# Patient Record
Sex: Male | Born: 1935 | Race: White | Hispanic: No | Marital: Married | Smoking: Never smoker
Health system: Southern US, Community
[De-identification: ages and names within clinical notes are randomized; demographics above are authoritative.]

## PROBLEM LIST (undated history)

## (undated) DIAGNOSIS — I639 Cerebral infarction, unspecified: Secondary | ICD-10-CM

## (undated) DIAGNOSIS — I4891 Unspecified atrial fibrillation: Secondary | ICD-10-CM

## (undated) DIAGNOSIS — F419 Anxiety disorder, unspecified: Secondary | ICD-10-CM

## (undated) HISTORY — PX: OTHER SURGICAL HISTORY: SHX169

## (undated) HISTORY — PX: CATARACT EXTRACTION: SUR2

## (undated) HISTORY — PX: SHOULDER SURGERY: SHX246

---

## 2004-08-11 ENCOUNTER — Emergency Department (HOSPITAL_COMMUNITY): Admission: EM | Admit: 2004-08-11 | Discharge: 2004-08-11 | Payer: Self-pay | Admitting: Emergency Medicine

## 2009-05-02 ENCOUNTER — Encounter: Admission: RE | Admit: 2009-05-02 | Discharge: 2009-05-30 | Payer: Self-pay | Admitting: Family Medicine

## 2009-05-16 ENCOUNTER — Encounter: Admission: RE | Admit: 2009-05-16 | Discharge: 2009-05-16 | Payer: Self-pay | Admitting: Family Medicine

## 2011-02-27 ENCOUNTER — Inpatient Hospital Stay (HOSPITAL_COMMUNITY)
Admission: RE | Admit: 2011-02-27 | Discharge: 2011-03-01 | DRG: 508 | Disposition: A | Discharge status: Home Health | Payer: Medicare Other | Source: Ambulatory Visit | Attending: Orthopedic Surgery | Admitting: Orthopedic Surgery

## 2011-02-27 DIAGNOSIS — M009 Pyogenic arthritis, unspecified: Secondary | ICD-10-CM

## 2011-02-27 LAB — CBC
HCT: 41.7 % (ref 39.0–52.0)
HCT: 39.4 % (ref 39.0–52.0)
Hemoglobin: 14.1 g/dL (ref 13.0–17.0)
MCV: 88.9 fL (ref 78.0–100.0)
Platelets: 254 10*3/uL (ref 150–400)
RBC: 4.69 MIL/uL (ref 4.22–5.81)
RBC: 4.44 MIL/uL (ref 4.22–5.81)
RDW: 13 % (ref 11.5–15.5)
WBC: 5.7 10*3/uL (ref 4.0–10.5)
WBC: 7.1 10*3/uL (ref 4.0–10.5)

## 2011-02-27 LAB — BASIC METABOLIC PANEL
BUN: 22 mg/dL (ref 6–23)
CO2: 28 mEq/L (ref 19–32)
Calcium: 9.8 mg/dL (ref 8.4–10.5)
Chloride: 101 mEq/L (ref 96–112)
GFR calc non Af Amer: 58 mL/min — ABNORMAL LOW (ref >60)
Glucose, Bld: 95 mg/dL (ref 70–99)

## 2011-02-27 LAB — URINALYSIS, ROUTINE W REFLEX MICROSCOPIC
Glucose, UA: NEGATIVE mg/dL
Hgb urine dipstick: NEGATIVE
Ketones, ur: NEGATIVE mg/dL
Protein, ur: NEGATIVE mg/dL
pH: 5.5 (ref 5.0–8.0)

## 2011-02-27 LAB — DIFFERENTIAL
Basophils Absolute: 0 10*3/uL (ref 0.0–0.1)
Basophils Absolute: 0 10*3/uL (ref 0.0–0.1)
Basophils Relative: 1 % (ref 0–1)
Basophils Relative: 0 % (ref 0–1)
Eosinophils Absolute: 0.2 10*3/uL (ref 0.0–0.7)
Eosinophils Absolute: 0.1 10*3/uL (ref 0.0–0.7)
Eosinophils Relative: 3 % (ref 0–5)
Eosinophils Relative: 1 % (ref 0–5)
Lymphocytes Relative: 25 % (ref 12–46)
Lymphs Abs: 0.9 10*3/uL (ref 0.7–4.0)
Monocytes Absolute: 0.4 10*3/uL (ref 0.1–1.0)
Neutro Abs: 5.8 10*3/uL (ref 1.7–7.7)
Neutrophils Relative %: 81 % — ABNORMAL HIGH (ref 43–77)

## 2011-02-27 LAB — GRAM STAIN

## 2011-02-27 LAB — C-REACTIVE PROTEIN: CRP: 0.21 mg/dL — ABNORMAL LOW (ref <0.60)

## 2011-02-27 LAB — APTT: aPTT: 27 seconds (ref 24–37)

## 2011-02-28 ENCOUNTER — Inpatient Hospital Stay (HOSPITAL_COMMUNITY): Payer: Medicare Other

## 2011-02-28 LAB — CBC
MCH: 31.1 pg (ref 26.0–34.0)
MCV: 88.4 fL (ref 78.0–100.0)
Platelets: 196 10*3/uL (ref 150–400)
RDW: 12.8 % (ref 11.5–15.5)

## 2011-02-28 LAB — BASIC METABOLIC PANEL
BUN: 16 mg/dL (ref 6–23)
CO2: 24 mEq/L (ref 19–32)
Chloride: 104 mEq/L (ref 96–112)
Creatinine, Ser: 1.05 mg/dL (ref 0.50–1.35)
GFR calc Af Amer: >60 mL/min (ref >60)
Glucose, Bld: 85 mg/dL (ref 70–99)

## 2011-03-01 DIAGNOSIS — M009 Pyogenic arthritis, unspecified: Secondary | ICD-10-CM

## 2011-03-01 LAB — CBC
HCT: 39.1 % (ref 39.0–52.0)
MCH: 31.7 pg (ref 26.0–34.0)
MCHC: 35.8 g/dL (ref 30.0–36.0)
RDW: 12.5 % (ref 11.5–15.5)

## 2011-03-01 LAB — BASIC METABOLIC PANEL
BUN: 16 mg/dL (ref 6–23)
CO2: 28 mEq/L (ref 19–32)
Chloride: 103 mEq/L (ref 96–112)
Creatinine, Ser: 1.12 mg/dL (ref 0.50–1.35)
Potassium: 4.1 mEq/L (ref 3.5–5.1)

## 2011-03-03 LAB — BODY FLUID CULTURE: Culture: NO GROWTH

## 2011-03-04 LAB — ANAEROBIC CULTURE

## 2011-03-15 NOTE — Op Note (Signed)
  NAMERICHIE, BONANNO                 ACCOUNT NO.:  [id number]  MEDICAL RECORD NO.:  [id number]  LOCATION:  5030                         FACILITY:  [facility]  PHYSICIAN:  Almedia Balls. Ranell Patrick, M.D. DATE OF BIRTH:  18-Nov-1934  DATE OF PROCEDURE:  02/27/2011 DATE OF DISCHARGE:                              OPERATIVE REPORT   PREOPERATIVE DIAGNOSIS:  Left shoulder infection.  POSTOPERATIVE DIAGNOSIS:  Left shoulder inflammation.  PROCEDURE PERFORMED:  Left shoulder arthroscopic incision and drainage both intra-articular and subacromial.  ATTENDING SURGEON:  Almedia Balls. Ranell Patrick, MD  ASSISTANT:  None.  ANESTHESIA:  General anesthesia was used.  ESTIMATED BLOOD LOSS:  Minimal.  FLUID REPLACEMENT:  1000 mL crystalloid.  INSTRUMENT COUNT:  Correct.  COMPLICATIONS:  None.  Preoperative antibiotics were given.  INDICATIONS:  The patient is a 76 year old male with history of left shoulder infection following shoulder arthroscopy and cuff repair.  Hehad an arthroscopic and open I and D about a week and half ago.  The patient has had persistent pain and a pressure-type filling in his shoulder and some swelling and he was brought back for secondary I and D today and inpatient admission for placement of PICC line and potential IV antibiotics were obtained IV consult as well.  DESCRIPTION OF PROCEDURE:  After an adequate level of anesthesia was achieved, the patient was positioned in the modified beach chair position.  Left shoulder was sterilely prepped and draped in the usual manner.  We entered the shoulder using standard arthroscopic portals including anterior, posterior and lateral portals.  Culture the fluid from the inside of the shoulder was cultured.  There was an effusion. This was bloody-type fluid.  No clotting to it.  Aerobic, anaerobic cultures and stat Gram stain which came back negative organisms with scattered white blood cells, mostly polys.  The patient then had a arthroscopic  I and D of the shoulder joint, where we noted the rotator cuff repair to be intact, lot of inflammation synovitis from the joint, but no signs of any obvious infection, although tissue appeared in shape.  This is hyperemic.  We did about 3 L in the joint and then we did 3 L subdeltoid.  The cuff repair again was fine from above.  PDS suture still intact and no sign of any significant purulent pocket served clot fluid after thorough subdeltoid and sub-interarticular arthroscopic I and D with inspection of the deltoid repair which was intact.  We went ahead and concluded the surgery suturing the wounds with 3-0 nylon.  Sterile compressive bandage was applied.  The patient placed a shoulder sling, taken to recovery room.     Almedia Balls. Ranell Patrick, M.D.     SRN/MEDQ  D:  02/27/2011  T:  02/27/2011  Job:  [id number]  Electronically Signed by Malon Kindle  on 03/15/2011 03:11:06 AM

## 2011-03-15 NOTE — Discharge Summary (Signed)
  NAMEANIEL, Brent Webb                 ACCOUNT NO.:  [id number]  MEDICAL RECORD NO.:  [id number]  LOCATION:  5030                         FACILITY:  [facility]  PHYSICIAN:  Almedia Balls. Ranell Patrick, M.D. DATE OF BIRTH:  1935-01-29  DATE OF ADMISSION:  02/27/2011 DATE OF DISCHARGE:  03/01/2011                              DISCHARGE SUMMARY   ADMISSION DIAGNOSIS:  Left shoulder infection versus inflammation.  DISCHARGE DIAGNOSIS:  Left shoulder infection versus inflammation, status post I and D.  BRIEF HISTORY:  The patient is a 76 year old male with worsening swelling, possible infection to the left shoulder.  The patient has been admitted prior to this admission for local I and D.  His inflammation has returned, thus patient was brought back in for IV antibiotics and a repeat irrigation and debridement.  PROCEDURE:  The patient had a left shoulder arthroscopic incision and drainage by Dr. Malon Kindle on February 27, 2011.  General anesthesia was used.  No complications.  HOSPITAL COURSE:  The patient was admitted on February 27, 2011, for the above-stated procedure which he tolerated well.  After adequate time in Post Anesthesia Care Unit, he was transferred to 5000.  The patient did have a PICC line placed and also Infectious Disease consult brought in who was recommended to treat him with broad-spectrum Rocephin and vancomycin for any type of infection to be in his left shoulder.  The patient was feeling well.  No pain.  Neurovascularly, he is intact and distally.  Capillary refill is less than 2 seconds and overall he was doing great.  Labs were within acceptable limits.  The patient to be discharged home on postop day #2 after 48 hours of IV antibiotics.  The patient overall was doing great and neurovascularly he is intact.  His labs were within acceptable limits.  DISCHARGE/PLAN:  The patient will be discharged home with PICC line and home health nursing on March 01, 2011.  His  condition is stable. His diet is regular.  DISCHARGE MEDICATIONS:  Rocephin 1 g IV daily, vancomycin 750 mg b.i.d. IV.  The patient declined need for pain medicines.  FOLLOWUP:  The patient to follow back up with Dr. Malon Kindle in 1 week.     Thomas B. Dixon, P.A.   ______________________________ Almedia Balls. Ranell Patrick, M.D.    TBD/MEDQ  D:  03/04/2011  T:  03/04/2011  Job:  [id number]  Electronically Signed by Standley Dakins P.A. on 03/06/2011 09:43:07 AM Electronically Signed by Malon Kindle  on 03/15/2011 03:11:03 AM

## 2011-03-22 ENCOUNTER — Encounter: Payer: Self-pay | Admitting: Infectious Diseases

## 2011-03-29 ENCOUNTER — Ambulatory Visit: Payer: Medicare Other | Admitting: Infectious Diseases

## 2011-07-04 ENCOUNTER — Encounter: Payer: Self-pay | Admitting: Infectious Diseases

## 2012-09-17 ENCOUNTER — Inpatient Hospital Stay (HOSPITAL_BASED_OUTPATIENT_CLINIC_OR_DEPARTMENT_OTHER)
Admission: EM | Admit: 2012-09-17 | Discharge: 2012-09-21 | DRG: 064 | Disposition: A | Discharge status: Home / Self Care | Payer: Medicare Other | Attending: Neurosurgery | Admitting: Neurosurgery

## 2012-09-17 ENCOUNTER — Encounter (HOSPITAL_BASED_OUTPATIENT_CLINIC_OR_DEPARTMENT_OTHER): Payer: Self-pay | Admitting: Family Medicine

## 2012-09-17 ENCOUNTER — Inpatient Hospital Stay (HOSPITAL_COMMUNITY): Payer: Medicare Other

## 2012-09-17 ENCOUNTER — Emergency Department (HOSPITAL_BASED_OUTPATIENT_CLINIC_OR_DEPARTMENT_OTHER): Payer: Medicare Other

## 2012-09-17 DIAGNOSIS — R569 Unspecified convulsions: Secondary | ICD-10-CM | POA: Diagnosis present

## 2012-09-17 DIAGNOSIS — G936 Cerebral edema: Secondary | ICD-10-CM | POA: Diagnosis present

## 2012-09-17 DIAGNOSIS — R209 Unspecified disturbances of skin sensation: Secondary | ICD-10-CM | POA: Diagnosis present

## 2012-09-17 DIAGNOSIS — I639 Cerebral infarction, unspecified: Secondary | ICD-10-CM

## 2012-09-17 DIAGNOSIS — F411 Generalized anxiety disorder: Secondary | ICD-10-CM | POA: Diagnosis present

## 2012-09-17 DIAGNOSIS — I619 Nontraumatic intracerebral hemorrhage, unspecified: Principal | ICD-10-CM | POA: Diagnosis present

## 2012-09-17 DIAGNOSIS — K7689 Other specified diseases of liver: Secondary | ICD-10-CM | POA: Diagnosis present

## 2012-09-17 DIAGNOSIS — I629 Nontraumatic intracranial hemorrhage, unspecified: Secondary | ICD-10-CM

## 2012-09-17 DIAGNOSIS — G40109 Localization-related (focal) (partial) symptomatic epilepsy and epileptic syndromes with simple partial seizures, not intractable, without status epilepticus: Secondary | ICD-10-CM

## 2012-09-17 DIAGNOSIS — G8190 Hemiplegia, unspecified affecting unspecified side: Secondary | ICD-10-CM | POA: Diagnosis present

## 2012-09-17 DIAGNOSIS — R279 Unspecified lack of coordination: Secondary | ICD-10-CM | POA: Diagnosis present

## 2012-09-17 DIAGNOSIS — D496 Neoplasm of unspecified behavior of brain: Secondary | ICD-10-CM | POA: Diagnosis present

## 2012-09-17 HISTORY — DX: Cerebral infarction, unspecified: I63.9

## 2012-09-17 HISTORY — DX: Anxiety disorder, unspecified: F41.9

## 2012-09-17 LAB — CBC WITH DIFFERENTIAL/PLATELET
Basophils Absolute: 0 10*3/uL (ref 0.0–0.1)
Lymphocytes Relative: 22 % (ref 12–46)
Lymphs Abs: 1.1 10*3/uL (ref 0.7–4.0)
MCV: 85.6 fL (ref 78.0–100.0)
Neutro Abs: 3.6 10*3/uL (ref 1.7–7.7)
Neutrophils Relative %: 69 % (ref 43–77)
Platelets: 231 10*3/uL (ref 150–400)
RBC: 4.85 MIL/uL (ref 4.22–5.81)
RDW: 12.4 % (ref 11.5–15.5)
WBC: 5.2 10*3/uL (ref 4.0–10.5)

## 2012-09-17 LAB — URINALYSIS, ROUTINE W REFLEX MICROSCOPIC
Bilirubin Urine: NEGATIVE
Glucose, UA: NEGATIVE mg/dL
Hgb urine dipstick: NEGATIVE
Protein, ur: NEGATIVE mg/dL
Urobilinogen, UA: 0.2 mg/dL (ref 0.0–1.0)

## 2012-09-17 LAB — APTT: aPTT: 27 seconds (ref 24–37)

## 2012-09-17 LAB — COMPREHENSIVE METABOLIC PANEL
ALT: 17 U/L (ref 0–53)
AST: 22 U/L (ref 0–37)
Alkaline Phosphatase: 53 U/L (ref 39–117)
CO2: 21 mEq/L (ref 19–32)
Chloride: 100 mEq/L (ref 96–112)
GFR calc Af Amer: 66 mL/min — ABNORMAL LOW (ref >90)
GFR calc non Af Amer: 57 mL/min — ABNORMAL LOW (ref >90)
Glucose, Bld: 122 mg/dL — ABNORMAL HIGH (ref 70–99)
Potassium: 4.1 mEq/L (ref 3.5–5.1)
Sodium: 136 mEq/L (ref 135–145)

## 2012-09-17 LAB — URINE MICROSCOPIC-ADD ON

## 2012-09-17 LAB — GLUCOSE, CAPILLARY: Glucose-Capillary: 101 mg/dL — ABNORMAL HIGH (ref 70–99)

## 2012-09-17 LAB — PROTIME-INR: Prothrombin Time: 12.4 seconds (ref 11.6–15.2)

## 2012-09-17 LAB — SURGICAL PCR SCREEN: MRSA, PCR: NEGATIVE

## 2012-09-17 MED ORDER — SODIUM CHLORIDE 0.9 % IV SOLN
1000.0000 mg | Freq: Once | INTRAVENOUS | Status: DC
  Filled 2012-09-17: qty 10

## 2012-09-17 MED ORDER — DEXAMETHASONE SODIUM PHOSPHATE 4 MG/ML IJ SOLN
8.0000 mg | Freq: Once | INTRAMUSCULAR | Status: AC
  Administered 2012-09-17: 8 mg via INTRAVENOUS
  Filled 2012-09-17: qty 1
  Filled 2012-09-17: qty 2
  Filled 2012-09-17: qty 4

## 2012-09-17 MED ORDER — PANTOPRAZOLE SODIUM 40 MG PO TBEC
40.0000 mg | DELAYED_RELEASE_TABLET | Freq: Every day | ORAL | Status: DC
  Administered 2012-09-17 – 2012-09-21 (×5): 40 mg via ORAL
  Filled 2012-09-17 (×5): qty 1

## 2012-09-17 MED ORDER — LORAZEPAM 2 MG/ML IJ SOLN
INTRAMUSCULAR | Status: AC
  Administered 2012-09-17: 0.5 mg
  Filled 2012-09-17: qty 1

## 2012-09-17 MED ORDER — DIPHENHYDRAMINE HCL 50 MG PO CAPS
50.0000 mg | ORAL_CAPSULE | Freq: Once | ORAL | Status: DC
  Filled 2012-09-17: qty 1

## 2012-09-17 MED ORDER — HYDROCODONE-ACETAMINOPHEN 10-325 MG PO TABS: 1.0000 | ORAL_TABLET | ORAL | Status: DC | PRN

## 2012-09-17 MED ORDER — SODIUM CHLORIDE 0.9 % IV SOLN
INTRAVENOUS | Status: DC
  Administered 2012-09-17 – 2012-09-18 (×3): via INTRAVENOUS

## 2012-09-17 MED ORDER — GADOBENATE DIMEGLUMINE 529 MG/ML IV SOLN
15.0000 mL | Freq: Once | INTRAVENOUS | Status: AC
  Administered 2012-09-17: 15 mL via INTRAVENOUS

## 2012-09-17 MED ORDER — LEVETIRACETAM ER 500 MG PO TB24
500.0000 mg | ORAL_TABLET | Freq: Every day | ORAL | Status: DC
  Administered 2012-09-17 – 2012-09-21 (×5): 500 mg via ORAL
  Filled 2012-09-17 (×7): qty 1

## 2012-09-17 MED ORDER — DEXAMETHASONE 4 MG PO TABS
4.0000 mg | ORAL_TABLET | Freq: Four times a day (QID) | ORAL | Status: DC
  Administered 2012-09-17 – 2012-09-21 (×16): 4 mg via ORAL
  Filled 2012-09-17 (×18): qty 1

## 2012-09-17 NOTE — ED Notes (Addendum)
Per [facility], pt was lying on floor at [facility], did not fall, no loc, pt was having anxiety attack with h/o same. Vitals wnl. Pt reports feeling dizzy, pain to left chest and legs "tight and twitching" while at store. Pt also sts he felt "pressure and needed to burp".

## 2012-09-17 NOTE — Progress Notes (Signed)
Patient ID: Brent Webb, male   DOB: 11-03-1934, 78 y.o.   MRN: [id number] Mri seen. Radiologist called the lesion as a hemorrhagic stroke but i am concern a the possibility of a tumor. i did speak with the patient and wife and fully explained about the findings. i will get a neurology consult and repeat the mri in 7 to 10 days. Both agree with the plan

## 2012-09-17 NOTE — ED Provider Notes (Signed)
History     CSN: [id number]  Arrival date & time 09/17/12  1149   First MD Initiated Contact with Patient 09/17/12 1206      Chief Complaint  Patient presents with  . Panic Attack    (Consider location/radiation/quality/duration/timing/severity/associated sxs/prior treatment) HPI Pt with sudden onset twitching in LLE and L hip while shopping. Pt became anxious and began deep breathing. Symptoms have now resolved. No CP at any point. No SOB, cough, fever, chills, HA, focal weakness. Per EMS presentation was consistent with anxiety attack for which pt has history of the same.  Past Medical History  Diagnosis Date  . Anxiety     Past Surgical History  Procedure Laterality Date  . Cataract extraction      History reviewed. No pertinent family history.  History  Substance Use Topics  . Smoking status: Never Smoker   . Smokeless tobacco: Not on file  . Alcohol Use: Yes      Review of Systems  Constitutional: Negative for fever and chills.  HENT: Negative for neck pain.   Eyes: Negative for visual disturbance.  Respiratory: Negative for cough, chest tightness and shortness of breath.   Cardiovascular: Negative for chest pain, palpitations and leg swelling.  Gastrointestinal: Negative for nausea, vomiting and abdominal pain.  Genitourinary: Negative for dysuria.  Musculoskeletal: Negative for myalgias and back pain.  Skin: Negative for pallor, rash and wound.  Neurological: Positive for tremors. Negative for dizziness, syncope, weakness, light-headedness, numbness and headaches.  Psychiatric/Behavioral: The patient is nervous/anxious.   All other systems reviewed and are negative.    Allergies  Review of patient's allergies indicates no known allergies.  Home Medications   No current outpatient prescriptions on file.  BP 131/56  Pulse 60  Temp(Src) 97.4 F (36.3 C) (Oral)  Resp 18  Ht 5\' 9"  (1.753 m)  Wt 143 lb 1.3 oz (64.9 kg)  BMI 21.12 kg/m2  SpO2  95%  Physical Exam  Nursing note and vitals reviewed. Constitutional: He is oriented to person, place, and time. He appears well-developed and well-nourished. No distress.  HENT:  Head: Normocephalic and atraumatic.  Mouth/Throat: Oropharynx is clear and moist.  Eyes: EOM are normal. Pupils are equal, round, and reactive to light.  Neck: Normal range of motion. Neck supple.  Cardiovascular: Normal rate and regular rhythm.   Pulmonary/Chest: Effort normal and breath sounds normal. No respiratory distress. He has no wheezes. He has no rales. He exhibits no tenderness.  Abdominal: Soft. Bowel sounds are normal. He exhibits no distension and no mass. There is no tenderness. There is no rebound and no guarding.  Musculoskeletal: Normal range of motion. He exhibits no edema and no tenderness.  No calf swelling or tenderness  Neurological: He is alert and oriented to person, place, and time.  5/5 motor in RUE/RLE. Sensation intact. 4/5 motor LUE/LLE.  Dyskinesis with LUE/LLE  Skin: Skin is warm and dry. No rash noted. No erythema.  Psychiatric: He has a normal mood and affect. His behavior is normal.    ED Course  Procedures (including critical care time)  Labs Reviewed  CBC WITH DIFFERENTIAL - Abnormal; Notable for the following:    MCHC 36.4 (*)    All other components within normal limits  COMPREHENSIVE METABOLIC PANEL - Abnormal; Notable for the following:    Glucose, Bld 122 (*)    GFR calc non Af Amer 57 (*)    GFR calc Af Amer 66 (*)    All other components within  normal limits  URINALYSIS, ROUTINE W REFLEX MICROSCOPIC - Abnormal; Notable for the following:    Ketones, ur 15 (*)    Leukocytes, UA TRACE (*)    All other components within normal limits  GLUCOSE, CAPILLARY - Abnormal; Notable for the following:    Glucose-Capillary 101 (*)    All other components within normal limits  SURGICAL PCR SCREEN  TROPONIN I  URINE MICROSCOPIC-ADD ON  PROTIME-INR  APTT  SEDIMENTATION  RATE   Ct Head Wo Contrast  09/17/2012  *RADIOLOGY REPORT*  Clinical Data: Dizziness, unsteady gait.  CT HEAD WITHOUT CONTRAST  Technique:  Contiguous axial images were obtained from the base of the skull through the vertex without contrast.  Comparison: None.  Findings: Bony calvarium appears intact.  There is noted a 2 cm hyperdensity seen involving the superior portion of the right parietal cortex with a large amount of surrounding white matter edema; this is consistent with intraparenchymal hemorrhage and potentially may be related to underlying neoplasm given the amount of white matter edema present.  Small amount of subarachnoid hemorrhage is seen medially in the vertex of the right parietal lobe.  Ventricular size is within normal limits.  No significant midline shift is noted currently.  IMPRESSION: Focal hyperdensity seen in right parietal cortex superiorly consistent with intraparenchymal hemorrhage.  Given the large amount of subcortical white matter edema present, this may be related to underlying neoplasm or malignancy.  MRI scan is recommended for further evaluation.  Small amount of subarachnoid hemorrhage is seen involving the medial portion of the vertex of the right parietal cortex.  Critical Value/emergent results were called by telephone at the time of interpretation on September 17, 2012 at 01:30 p.m. to Dr. Ranae Palms, who verbally acknowledged these results.   Original Report Authenticated By: Lupita Raider.,  M.D.    Ct Chest W Contrast  09/18/2012  *RADIOLOGY REPORT*  Clinical Data:  Right parietal brain mass.  Evaluate for primary malignancy or metastases.  CT CHEST, ABDOMEN AND PELVIS WITH CONTRAST  Technique:  Multidetector CT imaging of the chest, abdomen and pelvis was performed following the standard protocol during bolus administration of intravenous contrast.  Contrast: OMNIPAQUE IOHEXOL 300 MG/ML  SOLN  Comparison:  MRI brain 09/17/2012.  Chest radiographs 02/28/2011.  CT  CHEST  Findings:  There are no enlarged mediastinal, hilar or axillary lymph nodes.  There is minimal atherosclerosis of the aorta and coronary arteries.  There is no pleural or pericardial effusion.  Mild emphysematous changes are present.  No primary malignancy is identified within the chest.  There is a focal 5 mm ground-glass opacity in the right lower lobe on image 54, unlikely be clinically significant.  Nodular thickening along the left major fissure on image 37 is probably due to an intrapulmonary lymph node.  There are no worrisome osseous findings.  IMPRESSION: No evidence of primary malignancy or metastatic disease within the chest.  CT ABDOMEN AND PELVIS  Findings:  The hepatic parenchyma is heterogeneous with multiple small low-density lesions.  The largest of these is situated superiorly in the lateral segment of the left hepatic lobe, measuring 1.3 cm on image 52.  This demonstrates peripheral enhancement and could reflect a hemangioma.  The other lesions are too small to characterize.  Both adrenal glands demonstrate mild nodularity without dominant mass.  The spleen, pancreas and gallbladder appear normal.  There are right renal cystic lesions, the largest in the upper pole measuring 4.1 cm on image 67.  This  appears mildly complex.  However, no solid lesion is identified.  The left kidney is diffusely atrophied with cortical scarring and caliectasis. There are small left renal calculi.  No bowel lesions are identified.  There is moderate descending and sigmoid colon diverticulosis.  The appendix appears normal.  There are no enlarged abdominal pelvic lymph nodes.  The urinary bladder appears normal.  The prostate gland is mildly enlarged.  Degenerative changes are present throughout the spine.  There are no worrisome osseous findings.  IMPRESSION:  1.  No primary malignancy is identified within the abdomen or pelvis. 2.  Hepatic heterogeneity with multiple indeterminate small low- density lesions.  These could reflect metastases. 3.  Mild bilateral adrenal nodularity. 4.  Mildly complex cystic lesion in the upper pole of the right kidney is unlikely to account for the intracranial abnormality. Atrophy and cortical scarring of the left kidney.   Original Report Authenticated By: Carey Bullocks, M.D.    Mr Laqueta Jean Wo Contrast  09/17/2012  *RADIOLOGY REPORT*  Clinical Data: Abnormality on CT.  Rule out intracranial tumor. Dizziness and unsteady gait.  MRI HEAD WITHOUT AND WITH CONTRAST  Technique:  Multiplanar, multiecho pulse sequences of the brain and surrounding structures were obtained according to standard protocol without and with intravenous contrast  Contrast: 15mL MULTIHANCE GADOBENATE DIMEGLUMINE 529 MG/ML IV SOLN  Comparison: 09/17/2012 head CT.  No comparison MR brain.  Findings: Right parietal lobe of 5 x 3 x 3 cm hematoma.  Adjacent 1.3 x 1 x 1.2 cm hematoma.  Marked surrounding vasogenic edema. Local mass effect upon the right lateral ventricle which is displaced anteriorly and slightly inferiorly.  The cause of the hematoma is indeterminate.  This may represent changes of congophillic/amyloid angiopathy.  Result of cortical vein thrombosis is a possibility.  The adjacent major dural sinuses appear patent.  There is mild peripheral enhancement involving portions of the hematoma but without well-defined mass separate from hematoma. This will need to be further evaluated on follow-up imaging as hematoma clears to confirm that the hematoma clears as expected with a simple hematoma and rule out underlying mass not appreciated on the present exam.  There are prominent vessels in the region of the right parietal lobe hematoma which may represent compressed cortical vessels rather than vascular malformation.  Attention to this possibility on follow-up.  Small amount of subarachnoid blood right superior convexity most notable right frontal lobe probably represents breakthrough of right parietal lobe  hematoma into the subarachnoid space.  No intracranial mass seen separate from the above described findings.  No acute thrombotic infarct.  Mild small vessel disease type changes.  No hydrocephalus.  Major intracranial vascular structures are patent.  IMPRESSION: Right parietal lobe of 5 x 3 x 3 cm hematoma.  Adjacent 1.3 x 1 x 1.2 cm hematoma.  Marked surrounding vasogenic edema.  Local mass effect upon the right lateral ventricle which is displaced anteriorly and slightly inferiorly.  The cause of the hematoma is indeterminate.  This may represent changes of congophillic/amyloid angiopathy.  Result of cortical vein thrombosis is a possibility.  The adjacent major dural sinuses appear patent.  There is mild peripheral enhancement involving portions of the hematoma but without well-defined mass separate from hematoma. This will need to be further evaluated on follow-up imaging  to confirm that the hematoma clears as expected with a simple hematoma and rule out underlying mass not appreciated on the present exam.  There are prominent vessels in the region of the right parietal  lobe hematoma which may represent compressed cortical vessels rather than vascular malformation.  Attention to this possibility on follow-up.   Original Report Authenticated By: Lacy Duverney, M.D.    Ct Abdomen Pelvis W Contrast  09/18/2012  *RADIOLOGY REPORT*  Clinical Data:  Right parietal brain mass.  Evaluate for primary malignancy or metastases.  CT CHEST, ABDOMEN AND PELVIS WITH CONTRAST  Technique:  Multidetector CT imaging of the chest, abdomen and pelvis was performed following the standard protocol during bolus administration of intravenous contrast.  Contrast: OMNIPAQUE IOHEXOL 300 MG/ML  SOLN  Comparison:  MRI brain 09/17/2012.  Chest radiographs 02/28/2011.  CT CHEST  Findings:  There are no enlarged mediastinal, hilar or axillary lymph nodes.  There is minimal atherosclerosis of the aorta and coronary arteries.  There is  no pleural or pericardial effusion.  Mild emphysematous changes are present.  No primary malignancy is identified within the chest.  There is a focal 5 mm ground-glass opacity in the right lower lobe on image 54, unlikely be clinically significant.  Nodular thickening along the left major fissure on image 37 is probably due to an intrapulmonary lymph node.  There are no worrisome osseous findings.  IMPRESSION: No evidence of primary malignancy or metastatic disease within the chest.  CT ABDOMEN AND PELVIS  Findings:  The hepatic parenchyma is heterogeneous with multiple small low-density lesions.  The largest of these is situated superiorly in the lateral segment of the left hepatic lobe, measuring 1.3 cm on image 52.  This demonstrates peripheral enhancement and could reflect a hemangioma.  The other lesions are too small to characterize.  Both adrenal glands demonstrate mild nodularity without dominant mass.  The spleen, pancreas and gallbladder appear normal.  There are right renal cystic lesions, the largest in the upper pole measuring 4.1 cm on image 67.  This appears mildly complex.  However, no solid lesion is identified.  The left kidney is diffusely atrophied with cortical scarring and caliectasis. There are small left renal calculi.  No bowel lesions are identified.  There is moderate descending and sigmoid colon diverticulosis.  The appendix appears normal.  There are no enlarged abdominal pelvic lymph nodes.  The urinary bladder appears normal.  The prostate gland is mildly enlarged.  Degenerative changes are present throughout the spine.  There are no worrisome osseous findings.  IMPRESSION:  1.  No primary malignancy is identified within the abdomen or pelvis. 2.  Hepatic heterogeneity with multiple indeterminate small low- density lesions. These could reflect metastases. 3.  Mild bilateral adrenal nodularity. 4.  Mildly complex cystic lesion in the upper pole of the right kidney is unlikely to account  for the intracranial abnormality. Atrophy and cortical scarring of the left kidney.   Original Report Authenticated By: Carey Bullocks, M.D.      1. Intracranial bleed   2. Cytotoxic brain edema   3. Simple partial seizures      Date: 09/17/2012  Rate: 64  Rhythm: normal sinus rhythm  QRS Axis: normal  Intervals: normal  ST/T Wave abnormalities: normal  Conduction Disutrbances:none  Narrative Interpretation:   Old EKG Reviewed: none available    MDM   Discussed with Neurosurgery, Dr Jeral Fruit. Will accept in transfer to [facility] ICU. Suggested starting Keppra 1000mg  and Decadron 10 mg.       Loren Racer, MD 09/19/12 [phone]

## 2012-09-17 NOTE — ED Notes (Signed)
Pt attempted to ambulate to restroom, upon standing pt c/o dizziness and right leg feeling weak. Pt sts both resolved. Pt assisted back to stretcher and MD notified.

## 2012-09-17 NOTE — H&P (Signed)
Brent Webb is an 78 y.o. male.   Chief Complaint: tingling left side of his body HPI: patient who developed some tingling sensation in the left arm and lefsociated with some pressure in the right side of the neck. Seen at [facility] med-center and transferred to [place]  Past Medical History  Diagnosis Date  . Anxiety     Past Surgical History  Procedure Laterality Date  . Cataract extraction      No family history on file. Social History:  reports that he has never smoked. He does not have any smokeless tobacco history on file. He reports that  drinks alcohol. He reports that he does not use illicit drugs.  Allergies: No Known Allergies  Medications Prior to Admission  Medication Sig Dispense Refill  . Escitalopram Oxalate (LEXAPRO PO) Take by mouth.        Results for orders placed during the hospital encounter of 09/17/12 (from the past 48 hour(s))  CBC WITH DIFFERENTIAL     Status: Abnormal   Collection Time    09/17/12 12:10 PM      Result Value Range   WBC 5.2  4.0 - 10.5 K/uL   RBC 4.85  4.22 - 5.81 MIL/uL   Hemoglobin 15.1  13.0 - 17.0 g/dL   HCT 16.1  09.6 - 04.5 %   MCV 85.6  78.0 - 100.0 fL   MCH 31.1  26.0 - 34.0 pg   MCHC 36.4 (*) 30.0 - 36.0 g/dL   RDW 40.9  81.1 - 91.4 %   Platelets 231  150 - 400 K/uL   Neutrophils Relative 69  43 - 77 %   Neutro Abs 3.6  1.7 - 7.7 K/uL   Lymphocytes Relative 22  12 - 46 %   Lymphs Abs 1.1  0.7 - 4.0 K/uL   Monocytes Relative 7  3 - 12 %   Monocytes Absolute 0.4  0.1 - 1.0 K/uL   Eosinophils Relative 1  0 - 5 %   Eosinophils Absolute 0.1  0.0 - 0.7 K/uL   Basophils Relative 0  0 - 1 %   Basophils Absolute 0.0  0.0 - 0.1 K/uL  COMPREHENSIVE METABOLIC PANEL     Status: Abnormal   Collection Time    09/17/12 12:10 PM      Result Value Range   Sodium 136  135 - 145 mEq/L   Potassium 4.1  3.5 - 5.1 mEq/L   Chloride 100  96 - 112 mEq/L   CO2 21  19 - 32 mEq/L   Glucose, Bld 122 (*) 70 - 99 mg/dL   BUN 21  6 - 23 mg/dL   Creatinine, Ser 7.82  0.50 - 1.35 mg/dL   Calcium 9.6  8.4 - 95.6 mg/dL   Total Protein 6.8  6.0 - 8.3 g/dL   Albumin 4.0  3.5 - 5.2 g/dL   AST 22  0 - 37 U/L   ALT 17  0 - 53 U/L   Alkaline Phosphatase 53  39 - 117 U/L   Total Bilirubin 0.8  0.3 - 1.2 mg/dL   GFR calc non Af Amer 57 (*) >90 mL/min   GFR calc Af Amer 66 (*) >90 mL/min   Comment:            The eGFR has been calculated     using the CKD EPI equation.     This calculation has not been     validated in all clinical  situations.     eGFR's persistently     <90 mL/min signify     possible Chronic Kidney Disease.  TROPONIN I     Status: None   Collection Time    09/17/12 12:10 PM      Result Value Range   Troponin I <0.30  <0.30 ng/mL   Comment:            Due to the release kinetics of cTnI,     a negative result within the first hours     of the onset of symptoms does not rule out     myocardial infarction with certainty.     If myocardial infarction is still suspected,     repeat the test at appropriate intervals.  PROTIME-INR     Status: None   Collection Time    09/17/12 12:10 PM      Result Value Range   Prothrombin Time 12.4  11.6 - 15.2 seconds   INR 0.93  0.00 - 1.49  APTT     Status: None   Collection Time    09/17/12 12:10 PM      Result Value Range   aPTT 27  24 - 37 seconds  GLUCOSE, CAPILLARY     Status: Abnormal   Collection Time    09/17/12 12:58 PM      Result Value Range   Glucose-Capillary 101 (*) 70 - 99 mg/dL  URINALYSIS, ROUTINE W REFLEX MICROSCOPIC     Status: Abnormal   Collection Time    09/17/12  1:04 PM      Result Value Range   Color, Urine YELLOW  YELLOW   APPearance CLEAR  CLEAR   Specific Gravity, Urine 1.016  1.005 - 1.030   pH 7.5  5.0 - 8.0   Glucose, UA NEGATIVE  NEGATIVE mg/dL   Hgb urine dipstick NEGATIVE  NEGATIVE   Bilirubin Urine NEGATIVE  NEGATIVE   Ketones, ur 15 (*) NEGATIVE mg/dL   Protein, ur NEGATIVE  NEGATIVE mg/dL   Urobilinogen, UA 0.2  0.0 -  1.0 mg/dL   Nitrite NEGATIVE  NEGATIVE   Leukocytes, UA TRACE (*) NEGATIVE  URINE MICROSCOPIC-ADD ON     Status: None   Collection Time    09/17/12  1:04 PM      Result Value Range   Squamous Epithelial / LPF RARE  RARE   WBC, UA 0-2  <3 WBC/hpf   Bacteria, UA RARE  RARE   Urine-Other MUCOUS PRESENT     Ct Head Wo Contrast  09/17/2012  *RADIOLOGY REPORT*  Clinical Data: Dizziness, unsteady gait.  CT HEAD WITHOUT CONTRAST  Technique:  Contiguous axial images were obtained from the base of the skull through the vertex without contrast.  Comparison: None.  Findings: Bony calvarium appears intact.  There is noted a 2 cm hyperdensity seen involving the superior portion of the right parietal cortex with a large amount of surrounding white matter edema; this is consistent with intraparenchymal hemorrhage and potentially may be related to underlying neoplasm given the amount of white matter edema present.  Small amount of subarachnoid hemorrhage is seen medially in the vertex of the right parietal lobe.  Ventricular size is within normal limits.  No significant midline shift is noted currently.  IMPRESSION: Focal hyperdensity seen in right parietal cortex superiorly consistent with intraparenchymal hemorrhage.  Given the large amount of subcortical white matter edema present, this may be related to underlying neoplasm or malignancy.  MRI scan is recommended  for further evaluation.  Small amount of subarachnoid hemorrhage is seen involving the medial portion of the vertex of the right parietal cortex.  Critical Value/emergent results were called by telephone at the time of interpretation on September 17, 2012 at 01:30 p.m. to Dr. Ranae Palms, who verbally acknowledged these results.   Original Report Authenticated By: Lupita Raider.,  M.D.     Review of Systems  Constitutional: Negative.   HENT: Negative.   Respiratory: Negative.   Cardiovascular: Negative.   Gastrointestinal: Negative.   Genitourinary:  Negative.   Musculoskeletal: Negative.   Skin: Negative.   Neurological: Positive for tingling and focal weakness.  Endo/Heme/Allergies: Negative.   Psychiatric/Behavioral: Negative.     Blood pressure 138/72, pulse 70, temperature 98.4 F (36.9 C), temperature source Oral, resp. rate 20, height 5\' 9"  (1.753 m), weight 63.957 kg (141 lb), SpO2 100.00%. Physical Exam hent, nl. Neck, nl. Cv, nl. Lings clear. Abdomen, soft. Extremities nl. NEURO mentally normal. Cn. Nl. Strength normal BUT some weaknwess in the left leg more than arm with inability to coordinate the leg. Dysmetria present. Sensory, some tingling left leg. Gait, no tested. Ct head shows a lesion in the right parietal  Area to r/o the possibility of hemorrhagic tumor. Edema presengt  Assessment/Plan To have a mri of the brain with and w/out contrast. See orders  Lunden Stieber M 09/17/2012, 4:41 PM

## 2012-09-17 NOTE — Progress Notes (Signed)
Per [facility] ED RN, pt received Decadron 8 mg and Keppra 1000 mg via [facility].   Holly Bodily

## 2012-09-18 ENCOUNTER — Encounter (HOSPITAL_COMMUNITY): Payer: Self-pay | Admitting: *Deleted

## 2012-09-18 ENCOUNTER — Inpatient Hospital Stay (HOSPITAL_COMMUNITY): Payer: Medicare Other

## 2012-09-18 DIAGNOSIS — I629 Nontraumatic intracranial hemorrhage, unspecified: Secondary | ICD-10-CM

## 2012-09-18 DIAGNOSIS — G40109 Localization-related (focal) (partial) symptomatic epilepsy and epileptic syndromes with simple partial seizures, not intractable, without status epilepticus: Secondary | ICD-10-CM

## 2012-09-18 DIAGNOSIS — G936 Cerebral edema: Secondary | ICD-10-CM

## 2012-09-18 MED ORDER — IOHEXOL 300 MG/ML  SOLN
100.0000 mL | Freq: Once | INTRAMUSCULAR | Status: AC | PRN
  Administered 2012-09-18: 100 mL via INTRAVENOUS

## 2012-09-18 MED ORDER — IOHEXOL 300 MG/ML  SOLN: 25.0000 mL | Freq: Once | INTRAMUSCULAR | Status: DC | PRN

## 2012-09-18 MED ORDER — OFLOXACIN 0.3 % OP SOLN
1.0000 [drp] | Freq: Two times a day (BID) | OPHTHALMIC | Status: DC
  Administered 2012-09-18 – 2012-09-21 (×6): 1 [drp] via OPHTHALMIC
  Filled 2012-09-18: qty 5

## 2012-09-18 MED ORDER — ESCITALOPRAM OXALATE 20 MG PO TABS
20.0000 mg | ORAL_TABLET | Freq: Every day | ORAL | Status: DC
  Filled 2012-09-18 (×2): qty 1

## 2012-09-18 NOTE — Progress Notes (Signed)
Contrast solution given to patient per Radiology.

## 2012-09-18 NOTE — Consult Note (Signed)
Referring Physician: Dr. Hilda Lias.    Chief Complaint: left sided numbness and transient involuntary movements left leg.  HPI:                                                                                                                                         Brent Webb is an 78 y.o. male with a past medical history significant for anxiety, transferred from [facility] center for further evaluation of hemorrhagic stroke. He was in his usual state of heath until yesterday morning when developed sudden onset of " tightness and convulsive movements of the left leg, a numb sensation around my left hip, and later on some numbness of my face". He said that he never had similar symptoms before. Brent Webb indicated that he couldn't;t control the involuntary movements of the left leg which lasted for a couple of minutes. He tells me that he noticed some weakness of the left leg and arm which is now resolved. No associated headache, vertigo, double vision, difficulty swallowing, slurred speech, language or vision impairment. Denies recent fever, infection, or head trauma. No taking anticoagulants or antiplatelets. He was taken by ambulance to [facility] center where he had a CT brain that showed an area of focal hyperdensity in the right parietal cortex superiorly consistent with intraparenchymal hemorrhage as well as marked  amount of subcortical white matter edema present, concerning for an underlying neoplasm or malignancy.  A subsequent MRI brain with and without contrast revealed a right parietal lobe 5 x 3 x 3 cm hematoma with an adjacent 1.3 x 1 x 1.2 cm hematoma. Marked surrounding vasogenic edema, and mild enhancement. Local mass effect upon the right lateral ventricle which is displaced anteriorly and slightly inferiorly. He received 1 gram IV keppra and 8 mg IV decadron at [facility] ED.  At this moment, he offers no neurological complains but stressed the fact that a couple of weeks ago he and his wife  noticed that " I was acting confused".  Date last known well: 09/17/12 Time last known well: uncertain. tPA Given: no, cerebral bleeding.  Past Medical History  Diagnosis Date  . Anxiety     Past Surgical History  Procedure Laterality Date  . Cataract extraction      History reviewed. No pertinent family history. Social History:  reports that he has never smoked. He does not have any smokeless tobacco history on file. He reports that  drinks alcohol. He reports that he does not use illicit drugs.  Allergies: No Known Allergies  Medications:  I have reviewed the patient's current medications.  ROS:                                                                                                                                       History obtained from the patient and chart review.  General ROS: negative for - chills, fatigue, fever, night sweats, weight gain or weight loss Psychological ROS: negative for - behavioral disorder, hallucinations, memory difficulties, mood swings or suicidal ideation Ophthalmic ROS: negative for - blurry vision, double vision, eye pain or loss of vision ENT ROS: negative for - epistaxis, nasal discharge, oral lesions, sore throat, tinnitus or vertigo Allergy and Immunology ROS: negative for - hives or itchy/watery eyes Hematological and Lymphatic ROS: negative for - bleeding problems, bruising or swollen lymph nodes Endocrine ROS: negative for - galactorrhea, hair pattern changes, polydipsia/polyuria or temperature intolerance Respiratory ROS: negative for - cough, hemoptysis, shortness of breath or wheezing Cardiovascular ROS: negative for - chest pain, dyspnea on exertion, edema or irregular heartbeat Gastrointestinal ROS: negative for - abdominal pain, diarrhea, hematemesis, nausea/vomiting or stool incontinence Genito-Urinary  ROS: negative for - dysuria, hematuria, incontinence or urinary frequency/urgency Musculoskeletal ROS: negative for - joint swelling. Neurological ROS: as noted in HPI Dermatological ROS: negative for rash and skin lesion changes   Physical exam: pleasant male in no apparent distress. Blood pressure 92/63, pulse 80, temperature 97.4 F (36.3 C), temperature source Oral, resp. rate 20, height 5\' 9"  (1.753 m), weight 64.9 kg (143 lb 1.3 oz), SpO2 98.00%. Head: normocephalic. Neck: supple, no bruits, no JVD. Cardiac: no murmurs. Lungs: clear. Abdomen: soft, no tender, no mass. Extremities: no edema.    Neurologic Examination:                                                                                                      Mental Status: Alert, awake, oriented x 4, thought content appropriate.  Speech fluent without evidence of aphasia.  Able to follow 3 step commands without difficulty. Cranial Nerves: II: Discs flat bilaterally; Visual fields grossly normal, pupils equal, round, reactive to light and accommodation III,IV, VI: ptosis not present, extra-ocular motions intact bilaterally V,VII: smile symmetric, facial light touch sensation normal bilaterally VIII: hearing normal bilaterally IX,X: gag reflex present XI: bilateral shoulder shrug XII: midline tongue extension Motor: Right : Upper extremity   5/5    Left:     Upper extremity   5/5  Lower extremity   5/5  Lower extremity   5/5 Tone and bulk:normal tone throughout; no atrophy noted Sensory: Pinprick and light touch intact throughout, bilaterally Deep Tendon Reflexes: 2+ and symmetric throughout Plantars: Right: downgoing   Left: downgoing Cerebellar: normal finger-to-nose,  normal heel-to-shin test Gait: no ataxia. CV: pulses palpable throughout     Results for orders placed during the hospital encounter of 09/17/12 (from the past 48 hour(s))  CBC WITH DIFFERENTIAL     Status: Abnormal   Collection Time     09/17/12 12:10 PM      Result Value Range   WBC 5.2  4.0 - 10.5 K/uL   RBC 4.85  4.22 - 5.81 MIL/uL   Hemoglobin 15.1  13.0 - 17.0 g/dL   HCT 16.1  09.6 - 04.5 %   MCV 85.6  78.0 - 100.0 fL   MCH 31.1  26.0 - 34.0 pg   MCHC 36.4 (*) 30.0 - 36.0 g/dL   RDW 40.9  81.1 - 91.4 %   Platelets 231  150 - 400 K/uL   Neutrophils Relative 69  43 - 77 %   Neutro Abs 3.6  1.7 - 7.7 K/uL   Lymphocytes Relative 22  12 - 46 %   Lymphs Abs 1.1  0.7 - 4.0 K/uL   Monocytes Relative 7  3 - 12 %   Monocytes Absolute 0.4  0.1 - 1.0 K/uL   Eosinophils Relative 1  0 - 5 %   Eosinophils Absolute 0.1  0.0 - 0.7 K/uL   Basophils Relative 0  0 - 1 %   Basophils Absolute 0.0  0.0 - 0.1 K/uL  COMPREHENSIVE METABOLIC PANEL     Status: Abnormal   Collection Time    09/17/12 12:10 PM      Result Value Range   Sodium 136  135 - 145 mEq/L   Potassium 4.1  3.5 - 5.1 mEq/L   Chloride 100  96 - 112 mEq/L   CO2 21  19 - 32 mEq/L   Glucose, Bld 122 (*) 70 - 99 mg/dL   BUN 21  6 - 23 mg/dL   Creatinine, Ser 7.82  0.50 - 1.35 mg/dL   Calcium 9.6  8.4 - 95.6 mg/dL   Total Protein 6.8  6.0 - 8.3 g/dL   Albumin 4.0  3.5 - 5.2 g/dL   AST 22  0 - 37 U/L   ALT 17  0 - 53 U/L   Alkaline Phosphatase 53  39 - 117 U/L   Total Bilirubin 0.8  0.3 - 1.2 mg/dL   GFR calc non Af Amer 57 (*) >90 mL/min   GFR calc Af Amer 66 (*) >90 mL/min   Comment:            The eGFR has been calculated     using the CKD EPI equation.     This calculation has not been     validated in all clinical     situations.     eGFR's persistently     <90 mL/min signify     possible Chronic Kidney Disease.  TROPONIN I     Status: None   Collection Time    09/17/12 12:10 PM      Result Value Range   Troponin I <0.30  <0.30 ng/mL   Comment:            Due to the release kinetics of cTnI,     a negative result within the first hours  of the onset of symptoms does not rule out     myocardial infarction with certainty.     If myocardial  infarction is still suspected,     repeat the test at appropriate intervals.  PROTIME-INR     Status: None   Collection Time    09/17/12 12:10 PM      Result Value Range   Prothrombin Time 12.4  11.6 - 15.2 seconds   INR 0.93  0.00 - 1.49  APTT     Status: None   Collection Time    09/17/12 12:10 PM      Result Value Range   aPTT 27  24 - 37 seconds  GLUCOSE, CAPILLARY     Status: Abnormal   Collection Time    09/17/12 12:58 PM      Result Value Range   Glucose-Capillary 101 (*) 70 - 99 mg/dL  URINALYSIS, ROUTINE W REFLEX MICROSCOPIC     Status: Abnormal   Collection Time    09/17/12  1:04 PM      Result Value Range   Color, Urine YELLOW  YELLOW   APPearance CLEAR  CLEAR   Specific Gravity, Urine 1.016  1.005 - 1.030   pH 7.5  5.0 - 8.0   Glucose, UA NEGATIVE  NEGATIVE mg/dL   Hgb urine dipstick NEGATIVE  NEGATIVE   Bilirubin Urine NEGATIVE  NEGATIVE   Ketones, ur 15 (*) NEGATIVE mg/dL   Protein, ur NEGATIVE  NEGATIVE mg/dL   Urobilinogen, UA 0.2  0.0 - 1.0 mg/dL   Nitrite NEGATIVE  NEGATIVE   Leukocytes, UA TRACE (*) NEGATIVE  URINE MICROSCOPIC-ADD ON     Status: None   Collection Time    09/17/12  1:04 PM      Result Value Range   Squamous Epithelial / LPF RARE  RARE   WBC, UA 0-2  <3 WBC/hpf   Bacteria, UA RARE  RARE   Urine-Other MUCOUS PRESENT    SURGICAL PCR SCREEN     Status: None   Collection Time    09/17/12  5:10 PM      Result Value Range   MRSA, PCR NEGATIVE  NEGATIVE   Staphylococcus aureus NEGATIVE  NEGATIVE   Comment:            The Xpert SA Assay (FDA     approved for NASAL specimens     in patients over 52 years of age),     is one component of     a comprehensive surveillance     program.  Test performance has     been validated by [employer] for patients greater     than or equal to 49 year old.     It is not intended     to diagnose infection nor to     guide or monitor treatment.   Ct Head Wo Contrast  09/17/2012  *RADIOLOGY  REPORT*  Clinical Data: Dizziness, unsteady gait.  CT HEAD WITHOUT CONTRAST  Technique:  Contiguous axial images were obtained from the base of the skull through the vertex without contrast.  Comparison: None.  Findings: Bony calvarium appears intact.  There is noted a 2 cm hyperdensity seen involving the superior portion of the right parietal cortex with a large amount of surrounding white matter edema; this is consistent with intraparenchymal hemorrhage and potentially may be related to underlying neoplasm given the amount of white matter edema present.  Small amount of subarachnoid hemorrhage  is seen medially in the vertex of the right parietal lobe.  Ventricular size is within normal limits.  No significant midline shift is noted currently.  IMPRESSION: Focal hyperdensity seen in right parietal cortex superiorly consistent with intraparenchymal hemorrhage.  Given the large amount of subcortical white matter edema present, this may be related to underlying neoplasm or malignancy.  MRI scan is recommended for further evaluation.  Small amount of subarachnoid hemorrhage is seen involving the medial portion of the vertex of the right parietal cortex.  Critical Value/emergent results were called by telephone at the time of interpretation on September 17, 2012 at 01:30 p.m. to Dr. Ranae Palms, who verbally acknowledged these results.   Original Report Authenticated By: Lupita Raider.,  M.D.    Mr Laqueta Jean Wo Contrast  09/17/2012  *RADIOLOGY REPORT*  Clinical Data: Abnormality on CT.  Rule out intracranial tumor. Dizziness and unsteady gait.  MRI HEAD WITHOUT AND WITH CONTRAST  Technique:  Multiplanar, multiecho pulse sequences of the brain and surrounding structures were obtained according to standard protocol without and with intravenous contrast  Contrast: 15mL MULTIHANCE GADOBENATE DIMEGLUMINE 529 MG/ML IV SOLN  Comparison: 09/17/2012 head CT.  No comparison MR brain.  Findings: Right parietal lobe of 5 x 3 x 3 cm  hematoma.  Adjacent 1.3 x 1 x 1.2 cm hematoma.  Marked surrounding vasogenic edema. Local mass effect upon the right lateral ventricle which is displaced anteriorly and slightly inferiorly.  The cause of the hematoma is indeterminate.  This may represent changes of congophillic/amyloid angiopathy.  Result of cortical vein thrombosis is a possibility.  The adjacent major dural sinuses appear patent.  There is mild peripheral enhancement involving portions of the hematoma but without well-defined mass separate from hematoma. This will need to be further evaluated on follow-up imaging as hematoma clears to confirm that the hematoma clears as expected with a simple hematoma and rule out underlying mass not appreciated on the present exam.  There are prominent vessels in the region of the right parietal lobe hematoma which may represent compressed cortical vessels rather than vascular malformation.  Attention to this possibility on follow-up.  Small amount of subarachnoid blood right superior convexity most notable right frontal lobe probably represents breakthrough of right parietal lobe hematoma into the subarachnoid space.  No intracranial mass seen separate from the above described findings.  No acute thrombotic infarct.  Mild small vessel disease type changes.  No hydrocephalus.  Major intracranial vascular structures are patent.  IMPRESSION: Right parietal lobe of 5 x 3 x 3 cm hematoma.  Adjacent 1.3 x 1 x 1.2 cm hematoma.  Marked surrounding vasogenic edema.  Local mass effect upon the right lateral ventricle which is displaced anteriorly and slightly inferiorly.  The cause of the hematoma is indeterminate.  This may represent changes of congophillic/amyloid angiopathy.  Result of cortical vein thrombosis is a possibility.  The adjacent major dural sinuses appear patent.  There is mild peripheral enhancement involving portions of the hematoma but without well-defined mass separate from hematoma. This will need to  be further evaluated on follow-up imaging  to confirm that the hematoma clears as expected with a simple hematoma and rule out underlying mass not appreciated on the present exam.  There are prominent vessels in the region of the right parietal lobe hematoma which may represent compressed cortical vessels rather than vascular malformation.  Attention to this possibility on follow-up.   Original Report Authenticated By: Lacy Duverney, M.D.  [facility] [phone]  09/18/2012, 6:10 AM   Assessment: 78 y.o. male with new onset isolated left focal motor seizure most likely symptomatic of right parietal hemorrhagic lesion. The neuro-imaging findings are concerning for hemorrhage within a tumor. Agree with repeating MRI brain with and without contrast in 7-10 days. Adding high resolution MRS could add further information regarding the differential diagnosis of neoplastic versus non neoplastic nature of that lesion. Continue keppra 500 mg BID. Stroke team to provide further input/recommnedations this morning.   Wyatt Portela, MD [facility] [phone]  09/18/2012, 6:10 AM

## 2012-09-18 NOTE — Progress Notes (Signed)
Patient ID: Brent Webb, male   DOB: 08-30-1934, 78 y.o.   MRN: [id number] Seen by neuro. Doing well. No more seizures. Spoke with him and wife about the need to repeat mri brain to r/o the possibility of brain tumor

## 2012-09-18 NOTE — Progress Notes (Signed)
UR completed 

## 2012-09-18 NOTE — Progress Notes (Addendum)
Stroke Team Progress Note  HISTORY Brent Webb is an 78 y.o. male with a past medical history significant for anxiety, transferred from [facility] center for further evaluation of hemorrhagic stroke. He was in his usual state of heath until yesterday morning 09/17/2012 when developed sudden onset of " tightness and convulsive movements of the left leg, a numb sensation around my left hip, and later on some numbness of my face". He said that he never had similar symptoms before. Brent Webb indicated that he couldn't control the involuntary movements of the left leg which lasted for a couple of minutes. He tells me that he noticed some weakness of the left leg and arm which is now resolved. No associated headache, vertigo, double vision, difficulty swallowing, slurred speech, language or vision impairment. Denies recent fever, infection, or head trauma. No taking anticoagulants or antiplatelets.   He was taken by ambulance to [facility] center where he had a CT brain that showed an area of focal hyperdensity in the right parietal cortex superiorly consistent with intraparenchymal hemorrhage as well as marked amount of subcortical white matter edema present, concerning for an underlying neoplasm or malignancy. A subsequent MRI brain with and without contrast revealed a right parietal lobe 5 x 3 x 3 cm hematoma with an adjacent 1.3 x 1 x 1.2 cm hematoma. Marked surrounding vasogenic edema, and mild enhancement. Local mass effect upon the right lateral ventricle which is displaced anteriorly and slightly inferiorly.  He received 1 gram IV keppra and 8 mg IV decadron at [facility] ED.  Stressed the fact that a couple of weeks ago he and his wife noticed that " I was acting confused".  Patient was not a TPA candidate secondary to hemmorrhage. He was admitted to the neuro ICU for further evaluation and treatment.  SUBJECTIVE His RN is at the bedside.  He is sitting up in the chair at the bedside. He reports pressure in his head  about 1 week ago x 1 week. Recently, he reports more difficulty with multitasking. Overall he feels his condition is stable.   OBJECTIVE Most recent Vital Signs: Filed Vitals:   09/18/12 0500 09/18/12 0600 09/18/12 0800 09/18/12 0825  BP: 116/56  130/60   Pulse: 65  70   Temp:    98 F (36.7 C)  TempSrc:    Oral  Resp: 18  20   Height:      Weight:      SpO2: 96% 100% 100%    CBG (last 3)   Recent Labs  09/17/12 1258  GLUCAP 101*    IV Fluid Intake:   . sodium chloride 50 mL/hr at 09/18/12 0800    MEDICATIONS  . dexamethasone  4 mg Oral Q6H  . diphenhydrAMINE  50 mg Oral Once  . levETIRAcetam  500 mg Oral Daily  . levETIRAcetam  1,000 mg Intravenous Once  . pantoprazole  40 mg Oral Daily   PRN:  HYDROcodone-acetaminophen  Diet:  General thin liquids Activity:  Bedrest with Bathroom privileges DVT Prophylaxis:  SCDs   CLINICALLY SIGNIFICANT STUDIES Basic Metabolic Panel:   Recent Labs Lab 09/17/12 1210  NA 136  K 4.1  CL 100  CO2 21  GLUCOSE 122*  BUN 21  CREATININE 1.20  CALCIUM 9.6   Liver Function Tests:   Recent Labs Lab 09/17/12 1210  AST 22  ALT 17  ALKPHOS 53  BILITOT 0.8  PROT 6.8  ALBUMIN 4.0   CBC:   Recent Labs Lab 09/17/12  1210  WBC 5.2  NEUTROABS 3.6  HGB 15.1  HCT 41.5  MCV 85.6  PLT 231   Coagulation:   Recent Labs Lab 09/17/12 1210  LABPROT 12.4  INR 0.93   Cardiac Enzymes:   Recent Labs Lab 09/17/12 1210  TROPONINI <0.30   Urinalysis:   Recent Labs Lab 09/17/12 1304  COLORURINE YELLOW  LABSPEC 1.016  PHURINE 7.5  GLUCOSEU NEGATIVE  HGBUR NEGATIVE  BILIRUBINUR NEGATIVE  KETONESUR 15*  PROTEINUR NEGATIVE  UROBILINOGEN 0.2  NITRITE NEGATIVE  LEUKOCYTESUR TRACE*   Lipid Panel No results found for this basename: chol,  trig,  hdl,  cholhdl,  vldl,  ldlcalc   HgbA1C  No results found for this basename: HGBA1C   Urine Drug Screen:   No results found for this basename: labopia,  cocainscrnur,   labbenz,  amphetmu,  thcu,  labbarb    Alcohol Level: No results found for this basename: ETH,  in the last 168 hours  CT of the brain  09/17/2012   Focal hyperdensity seen in right parietal cortex superiorly consistent with intraparenchymal hemorrhage.  Given the large amount of subcortical white matter edema present, this may be related to underlying neoplasm or malignancy.  MRI scan is recommended for further evaluation.  Small amount of subarachnoid hemorrhage is seen involving the medial portion of the vertex of the right parietal cortex.   MRI of the brain  09/17/2012   Right parietal lobe of 5 x 3 x 3 cm hematoma.  Adjacent 1.3 x 1 x 1.2 cm hematoma.  Marked surrounding vasogenic edema.  Local mass effect upon the right lateral ventricle which is displaced anteriorly and slightly inferiorly.  The cause of the hematoma is indeterminate.  This may represent changes of congophillic/amyloid angiopathy.  Result of cortical vein thrombosis is a possibility.  The adjacent major dural sinuses appear patent.  There is mild peripheral enhancement involving portions of the hematoma but without well-defined mass separate from hematoma. This will need to be further evaluated on follow-up imaging  to confirm that the hematoma clears as expected with a simple hematoma and rule out underlying mass not appreciated on the present exam.  There are prominent vessels in the region of the right parietal lobe hematoma which may represent compressed cortical vessels rather than vascular malformation.  Attention to this possibility on follow-up.     MRA of the brain    CT Chest  CT Abdomen and Pelvis  EKG  normal EKG, normal sinus rhythm.   Therapy Recommendations   Physical Exam   Pleasant healthy-looking middle-aged Caucasian male currently not in distress.Awake alert. Afebrile. Head is nontraumatic. Neck is supple without bruit. Hearing is normal. Cardiac exam no murmur or gallop. Lungs are clear to auscultation.  Distal pulses are well felt. Neurological Exam : Awake alert oriented x3 with normal speech and language. Slightly diminished attention and recall. Extraocular movements are full range without nystagmus. Fundi were not visualized. Pupils equal reactive. Face is symmetric without weakness tongue is midline. Motor system exam reveals no upper or lower extremity drift but  mild weakness of left grip and intrinsic hand muscles as well as left hip flexors and ankle dorsiflexors. Deep tendon flexes are 2+ symmetric. Plantars are both downgoing. Sensation appears intact. Coordination is slow but accurate. Gait was not tested. 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  Imaging confirms a right parietal hemorrhage with significant vasogenic edema, ? tumor.  Differential:  Primary tumor with hemorrhage within tumor, metastatic tumor with hemorrhage, or hemorrhagic infarct.  On no antiplatelets prior to admission.  Patient with resultant very mild left hemiparesis. Work up underway.   Anxiety, on ativan and lexapro PTA  Recent cataract extraction  Hospital day # 1  TREATMENT/PLAN  Given hemorrhage and risk of expansion, continue ICU level care for 24h   CT chest, abd and pelvis to rule out possible primary sources of tumor  ESR  CT head in am  Continue keppra  Agree with Decadron  Annie Main, MSN, RN, ANVP-BC, ANP-BC, GNP-BC [facility] Pager: [phone] 09/18/2012 8:53 AM  I have personally obtained a history, examined the patient, evaluated imaging results, and formulated the assessment and plan of care. I agree with the above. This patient is critically ill and at significant risk of neurological worsening, death and care requires constant monitoring of vital signs, hemodynamics,respiratory and cardiac monitoring,review of multiple databases, neurological assessment, discussion with family, other  specialists and medical decision making of high complexity. I spent 30 minutes of neurocritical care time  in the care of  this patient. Delia Heady, MD

## 2012-09-19 ENCOUNTER — Inpatient Hospital Stay (HOSPITAL_COMMUNITY): Payer: Medicare Other

## 2012-09-19 NOTE — Evaluation (Signed)
Physical Therapy Evaluation Patient Details Name: Brent Webb MRN: [id number] DOB: 10/28/1934 Today's Date: 09/19/2012 Time: 4098-1191 PT Time Calculation (min): 42 min  PT Assessment / Plan / Recommendation Clinical Impression  Pt. was admitted following onset of tingling of L arm, convulsive movements of L LE.  MRI reveals R parietal hemorrhagic lesion with vasogenic edema worrisome for  hemorrhage with tumor.  Pt also had recent cataract surgery.  He presents to PT with balance disturbance only during high level balance tasks.  Will benefit from acute PT and follow up OPPT to work on balance issues.  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      PT Assessment  Patient needs continued PT services    Follow Up Recommendations  Outpatient PT;Supervision - Intermittent    Does the patient have the potential to tolerate intense rehabilitation      Barriers to Discharge None      Equipment Recommendations  None recommended by PT    Recommendations for Other Services     Frequency Min 4X/week    Precautions / Restrictions Precautions Precautions: Fall Restrictions Weight Bearing Restrictions: No   Pertinent Vitals/Pain Denied pain      Mobility  Bed Mobility Bed Mobility: Not assessed (pt. up in room ad lib) Transfers Transfers: Sit to Stand;Stand to Sit Sit to Stand: 7: Independent;From chair/3-in-1;Without upper extremity assist Stand to Sit: 7: Independent;Without upper extremity assist;To chair/3-in-1 Details for Transfer Assistance: manages well Ambulation/Gait Ambulation/Gait Assistance: 7: Independent Ambulation Distance (Feet): 200 Feet Assistive device: None Ambulation/Gait Assistance Details: no overt LOB noted in ambulation on level surfaces Gait Pattern: Within Functional Limits Gait velocity: wnl Stairs: Yes Stair Management Technique: No rails;Alternating pattern;Forwards Number of Stairs:  3 Wheelchair Mobility Wheelchair Mobility: No    Exercises     PT Diagnosis: Other (comment) (difficulty with balance)  PT Problem List: Decreased balance;Decreased activity tolerance;Decreased knowledge of precautions PT Treatment Interventions: Balance training;Stair training;Gait training;Functional mobility training;Therapeutic activities;Patient/family education   PT Goals Acute Rehab PT Goals PT Goal Formulation: With patient Time For Goal Achievement: 09/26/12 Potential to Achieve Goals: Good Pt will go Supine/Side to Sit: Independently PT Goal: Supine/Side to Sit - Progress: Goal set today Pt will go Sit to Supine/Side: Independently PT Goal: Sit to Supine/Side - Progress: Goal set today Additional Goals Additional Goal #1: pt. will be able to  left  single leg stand >10 seconds to reduce risk of fall  PT Goal: Additional Goal #1 - Progress: Goal set today Additional Goal #2: pt. will be able to left tandem stance >10 seconds for reduced risk of fall PT Goal: Additional Goal #2 - Progress: Goal set today  Visit Information  Last PT Received On: 09/19/12 Assistance Needed: +1    Subjective Data  Subjective: Pt. 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   Prior Functioning  Home Living Lives With: Spouse Available Help at Discharge: Family;Available 24 hours/day Type of Home: House Home Access: Stairs to enter [employer] of Steps: 4 Entrance Stairs-Rails: Right Home Layout: Two level;Able to live on main level with bedroom/bathroom Bathroom Shower/Tub: Walk-in shower;Door Dentist: None Prior Function Level of Independence: Independent Able to Take Stairs?: Yes Driving: Yes Vocation: Part time employment Comments: 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 with limited memory of onset of seizures/hemmorhage) Arousal/Alertness: Awake/alert Orientation Level: Oriented X4 / Intact Behavior During Session: WFL for tasks performed Memory:  (limited memory of events preceeding admission)    Extremity/Trunk Assessment Right Upper Extremity Assessment RUE ROM/Strength/Tone: WFL for tasks assessed RUE Sensation: WFL - Light Touch RUE Coordination: WFL - gross/fine motor Left Upper Extremity Assessment LUE ROM/Strength/Tone: WFL for tasks assessed LUE Sensation: WFL - Light Touch LUE Coordination: WFL - gross/fine motor Right Lower Extremity Assessment RLE ROM/Strength/Tone: Within functional levels RLE Sensation: WFL - Light Touch RLE Coordination: WFL - gross/fine motor Left Lower Extremity Assessment LLE ROM/Strength/Tone: Within functional levels LLE Sensation: WFL - Light Touch LLE Coordination: WFL - gross/fine motor Trunk Assessment Trunk Assessment: Normal   Balance Balance Balance Assessed: Yes Static Standing Balance Static Standing - Balance Support: No upper extremity supported Static Standing - Level of Assistance: 7: Independent Single Leg Stance - Right Leg: 8 Single Leg Stance - Left Leg: 3 Tandem Stance - Right Leg: 8 Tandem Stance - Left Leg: 3 Dynamic Standing Balance Dynamic Standing - Balance Support: No upper extremity supported Dynamic Standing - Level of Assistance: 5: Stand by assistance;7: Independent Dynamic Standing - Comments: pt. with notable balance issue in left tnadem stance and left single leg stance as compared to right with increased sway and need for min guard assist as compared to independent on right side  End of Session PT - End of Session Equipment Utilized During Treatment: Gait belt Activity Tolerance: Patient tolerated treatment well Patient left:  in chair;with call bell/phone within reach;with family/visitor present (wife and son) Nurse Communication: Mobility status;Other (comment) (balance disturbance with high level tasks)  GP     Ferman Hamming 09/19/2012, 2:22 PM Weldon Picking PT Acute Rehab Services [phone] Beeper [phone]

## 2012-09-19 NOTE — Progress Notes (Signed)
Subjective: Patient sitting up in chair, without complaints. Denies further seizure activity. Continues on Keppra 500 XR daily per the neurology/stroke service. Continues on Decadron 4 mg every 6 hours per Dr. Jeral Fruit. CT this morning shows continued evolution/resolution of right parietal intracerebral hematoma. CT scan of the chest abdomen and pelvis did not reveal obvious malignancy per the radiology interpretation.  Objective: Vital signs in last 24 hours: Filed Vitals:   09/18/12 1600 09/18/12 1857 09/19/12 0200 09/19/12 0600  BP:  110/75 131/56 132/56  Pulse:  77 60 60  Temp:  98.1 F (36.7 C) 97.4 F (36.3 C) 98.1 F (36.7 C)  TempSrc:  Oral Oral Oral  Resp: 18 18 18 18   Height:  5\' 9"  (1.753 m)    Weight:  64.9 kg (143 lb 1.3 oz)    SpO2:  100% 95% 98%    Intake/Output from previous day: 04/11 0701 - 04/12 0700 In: 690 [P.O.:240; I.V.:450] Out: -  Intake/Output this shift:    Physical Exam:  Awake and alert, oriented. Following commands. Moving all 4 extremities well.  CBC  Recent Labs  09/17/12 1210  WBC 5.2  HGB 15.1  HCT 41.5  PLT 231   BMET  Recent Labs  09/17/12 1210  NA 136  K 4.1  CL 100  CO2 21  GLUCOSE 122*  BUN 21  CREATININE 1.20  CALCIUM 9.6    Studies/Results: Ct Head Wo Contrast  09/19/2012  *RADIOLOGY REPORT*  Clinical Data: Follow up intracranial hematoma.  CT HEAD WITHOUT CONTRAST  Technique:  Contiguous axial images were obtained from the base of the skull through the vertex without contrast.  Comparison: Head CT 09/17/2012 and brain MRI 09/17/2012  Findings: The dominant hematoma in the right parietal lobe has indistinct borders.  The hematoma roughly measures 2.1 x 1.5 cm on image 21 and previously measured 2.3 x 1.6 cm.  The adjacent small hematoma seen on the MRI is poorly characterized on this examination.  There is no significant change in the vasogenic edema.  There is stable subarachnoid blood near the right vertex. There is no  evidence for a new hemorrhage.  No significant midline shift or hydrocephalus. No acute bony abnormality.  IMPRESSION: The dominant hematoma in the right parietal lobe is slightly smaller in size but the margins are indistinct.  No significant change in the surrounding vasogenic edema.  No significant change in the subarachnoid blood near the right vertex.   Original Report Authenticated By: Richarda Overlie, M.D.    Ct Head Wo Contrast  09/17/2012  *RADIOLOGY REPORT*  Clinical Data: Dizziness, unsteady gait.  CT HEAD WITHOUT CONTRAST  Technique:  Contiguous axial images were obtained from the base of the skull through the vertex without contrast.  Comparison: None.  Findings: Bony calvarium appears intact.  There is noted a 2 cm hyperdensity seen involving the superior portion of the right parietal cortex with a large amount of surrounding white matter edema; this is consistent with intraparenchymal hemorrhage and potentially may be related to underlying neoplasm given the amount of white matter edema present.  Small amount of subarachnoid hemorrhage is seen medially in the vertex of the right parietal lobe.  Ventricular size is within normal limits.  No significant midline shift is noted currently.  IMPRESSION: Focal hyperdensity seen in right parietal cortex superiorly consistent with intraparenchymal hemorrhage.  Given the large amount of subcortical white matter edema present, this may be related to underlying neoplasm or malignancy.  MRI scan is recommended for  further evaluation.  Small amount of subarachnoid hemorrhage is seen involving the medial portion of the vertex of the right parietal cortex.  Critical Value/emergent results were called by telephone at the time of interpretation on September 17, 2012 at 01:30 p.m. to Dr. Ranae Palms, who verbally acknowledged these results.   Original Report Authenticated By: Lupita Raider.,  M.D.    Ct Chest W Contrast  09/18/2012  *RADIOLOGY REPORT*  Clinical Data:  Right  parietal brain mass.  Evaluate for primary malignancy or metastases.  CT CHEST, ABDOMEN AND PELVIS WITH CONTRAST  Technique:  Multidetector CT imaging of the chest, abdomen and pelvis was performed following the standard protocol during bolus administration of intravenous contrast.  Contrast: OMNIPAQUE IOHEXOL 300 MG/ML  SOLN  Comparison:  MRI brain 09/17/2012.  Chest radiographs 02/28/2011.  CT CHEST  Findings:  There are no enlarged mediastinal, hilar or axillary lymph nodes.  There is minimal atherosclerosis of the aorta and coronary arteries.  There is no pleural or pericardial effusion.  Mild emphysematous changes are present.  No primary malignancy is identified within the chest.  There is a focal 5 mm ground-glass opacity in the right lower lobe on image 54, unlikely be clinically significant.  Nodular thickening along the left major fissure on image 37 is probably due to an intrapulmonary lymph node.  There are no worrisome osseous findings.  IMPRESSION: No evidence of primary malignancy or metastatic disease within the chest.  CT ABDOMEN AND PELVIS  Findings:  The hepatic parenchyma is heterogeneous with multiple small low-density lesions.  The largest of these is situated superiorly in the lateral segment of the left hepatic lobe, measuring 1.3 cm on image 52.  This demonstrates peripheral enhancement and could reflect a hemangioma.  The other lesions are too small to characterize.  Both adrenal glands demonstrate mild nodularity without dominant mass.  The spleen, pancreas and gallbladder appear normal.  There are right renal cystic lesions, the largest in the upper pole measuring 4.1 cm on image 67.  This appears mildly complex.  However, no solid lesion is identified.  The left kidney is diffusely atrophied with cortical scarring and caliectasis. There are small left renal calculi.  No bowel lesions are identified.  There is moderate descending and sigmoid colon diverticulosis.  The appendix  appears normal.  There are no enlarged abdominal pelvic lymph nodes.  The urinary bladder appears normal.  The prostate gland is mildly enlarged.  Degenerative changes are present throughout the spine.  There are no worrisome osseous findings.  IMPRESSION:  1.  No primary malignancy is identified within the abdomen or pelvis. 2.  Hepatic heterogeneity with multiple indeterminate small low- density lesions. These could reflect metastases. 3.  Mild bilateral adrenal nodularity. 4.  Mildly complex cystic lesion in the upper pole of the right kidney is unlikely to account for the intracranial abnormality. Atrophy and cortical scarring of the left kidney.   Original Report Authenticated By: Carey Bullocks, M.D.    Mr Laqueta Jean Wo Contrast  09/17/2012  *RADIOLOGY REPORT*  Clinical Data: Abnormality on CT.  Rule out intracranial tumor. Dizziness and unsteady gait.  MRI HEAD WITHOUT AND WITH CONTRAST  Technique:  Multiplanar, multiecho pulse sequences of the brain and surrounding structures were obtained according to standard protocol without and with intravenous contrast  Contrast: 15mL MULTIHANCE GADOBENATE DIMEGLUMINE 529 MG/ML IV SOLN  Comparison: 09/17/2012 head CT.  No comparison MR brain.  Findings: Right parietal lobe of 5 x 3 x 3  cm hematoma.  Adjacent 1.3 x 1 x 1.2 cm hematoma.  Marked surrounding vasogenic edema. Local mass effect upon the right lateral ventricle which is displaced anteriorly and slightly inferiorly.  The cause of the hematoma is indeterminate.  This may represent changes of congophillic/amyloid angiopathy.  Result of cortical vein thrombosis is a possibility.  The adjacent major dural sinuses appear patent.  There is mild peripheral enhancement involving portions of the hematoma but without well-defined mass separate from hematoma. This will need to be further evaluated on follow-up imaging as hematoma clears to confirm that the hematoma clears as expected with a simple hematoma and rule out  underlying mass not appreciated on the present exam.  There are prominent vessels in the region of the right parietal lobe hematoma which may represent compressed cortical vessels rather than vascular malformation.  Attention to this possibility on follow-up.  Small amount of subarachnoid blood right superior convexity most notable right frontal lobe probably represents breakthrough of right parietal lobe hematoma into the subarachnoid space.  No intracranial mass seen separate from the above described findings.  No acute thrombotic infarct.  Mild small vessel disease type changes.  No hydrocephalus.  Major intracranial vascular structures are patent.  IMPRESSION: Right parietal lobe of 5 x 3 x 3 cm hematoma.  Adjacent 1.3 x 1 x 1.2 cm hematoma.  Marked surrounding vasogenic edema.  Local mass effect upon the right lateral ventricle which is displaced anteriorly and slightly inferiorly.  The cause of the hematoma is indeterminate.  This may represent changes of congophillic/amyloid angiopathy.  Result of cortical vein thrombosis is a possibility.  The adjacent major dural sinuses appear patent.  There is mild peripheral enhancement involving portions of the hematoma but without well-defined mass separate from hematoma. This will need to be further evaluated on follow-up imaging  to confirm that the hematoma clears as expected with a simple hematoma and rule out underlying mass not appreciated on the present exam.  There are prominent vessels in the region of the right parietal lobe hematoma which may represent compressed cortical vessels rather than vascular malformation.  Attention to this possibility on follow-up.   Original Report Authenticated By: Lacy Duverney, M.D.    Ct Abdomen Pelvis W Contrast  09/18/2012  *RADIOLOGY REPORT*  Clinical Data:  Right parietal brain mass.  Evaluate for primary malignancy or metastases.  CT CHEST, ABDOMEN AND PELVIS WITH CONTRAST  Technique:  Multidetector CT imaging of the  chest, abdomen and pelvis was performed following the standard protocol during bolus administration of intravenous contrast.  Contrast: OMNIPAQUE IOHEXOL 300 MG/ML  SOLN  Comparison:  MRI brain 09/17/2012.  Chest radiographs 02/28/2011.  CT CHEST  Findings:  There are no enlarged mediastinal, hilar or axillary lymph nodes.  There is minimal atherosclerosis of the aorta and coronary arteries.  There is no pleural or pericardial effusion.  Mild emphysematous changes are present.  No primary malignancy is identified within the chest.  There is a focal 5 mm ground-glass opacity in the right lower lobe on image 54, unlikely be clinically significant.  Nodular thickening along the left major fissure on image 37 is probably due to an intrapulmonary lymph node.  There are no worrisome osseous findings.  IMPRESSION: No evidence of primary malignancy or metastatic disease within the chest.  CT ABDOMEN AND PELVIS  Findings:  The hepatic parenchyma is heterogeneous with multiple small low-density lesions.  The largest of these is situated superiorly in the lateral segment of the left  hepatic lobe, measuring 1.3 cm on image 52.  This demonstrates peripheral enhancement and could reflect a hemangioma.  The other lesions are too small to characterize.  Both adrenal glands demonstrate mild nodularity without dominant mass.  The spleen, pancreas and gallbladder appear normal.  There are right renal cystic lesions, the largest in the upper pole measuring 4.1 cm on image 67.  This appears mildly complex.  However, no solid lesion is identified.  The left kidney is diffusely atrophied with cortical scarring and caliectasis. There are small left renal calculi.  No bowel lesions are identified.  There is moderate descending and sigmoid colon diverticulosis.  The appendix appears normal.  There are no enlarged abdominal pelvic lymph nodes.  The urinary bladder appears normal.  The prostate gland is mildly enlarged.  Degenerative  changes are present throughout the spine.  There are no worrisome osseous findings.  IMPRESSION:  1.  No primary malignancy is identified within the abdomen or pelvis. 2.  Hepatic heterogeneity with multiple indeterminate small low- density lesions. These could reflect metastases. 3.  Mild bilateral adrenal nodularity. 4.  Mildly complex cystic lesion in the upper pole of the right kidney is unlikely to account for the intracranial abnormality. Atrophy and cortical scarring of the left kidney.   Original Report Authenticated By: Carey Bullocks, M.D.     Assessment/Plan: Continue current supportive care. Will need continued followup to see if there is an underlying malignancy beneath this ICH despite the MRI so far not revealing such. Continued followup with Dr. Jeral Fruit. Encouraged to ambulate.   Hewitt Shorts, MD 09/19/2012, 10:02 AM

## 2012-09-19 NOTE — Progress Notes (Addendum)
Pt brought some home meds, two different types of eye drops Ofloxacin and Prolensa 1 drop each in both eyes. Checked with Dr.Nudelman and dr mentioned pt can do self administration. [place]

## 2012-09-19 NOTE — Progress Notes (Signed)
Stroke Team Progress Note  HISTORY Brent Webb is an 78 y.o. male with a past medical history significant for anxiety, transferred from [facility] for further evaluation of hemorrhagic stroke. He was in his usual state of heath until yesterday morning 09/17/2012 when developed sudden onset of " tightness and convulsive movements of the left leg, a numb sensation around my left hip, and later on some numbness of my face". He said that he never had similar symptoms before. [name] indicated that he couldn't control the involuntary movements of the left leg which lasted for a couple of minutes. He tells me that he noticed some weakness of the left leg and arm which is now resolved. No associated headache, vertigo, double vision, difficulty swallowing, slurred speech, language or vision impairment. Denies recent fever, infection, or head trauma. No taking anticoagulants or antiplatelets.   He was taken by ambulance to [facility] where he had a CT brain that showed an area of focal hyperdensity in the right parietal cortex superiorly consistent with intraparenchymal hemorrhage as well as marked amount of subcortical white matter edema present, concerning for an underlying neoplasm or malignancy. A subsequent MRI brain with and without contrast revealed a right parietal lobe 5 x 3 x 3 cm hematoma with an adjacent 1.3 x 1 x 1.2 cm hematoma. Marked surrounding vasogenic edema, and mild enhancement. Local mass effect upon the right lateral ventricle which is displaced anteriorly and slightly inferiorly.  He received 1 gram IV keppra and 8 mg IV decadron at [facility] ED.  Stressed the fact that a couple of weeks ago he and his wife noticed that " I was acting confused".  Patient was not a TPA candidate secondary to hemmorrhage. He was admitted to the neuro ICU for further evaluation and treatment.  SUBJECTIVE There no family members in the room this morning. The patient has no complaints. He feels mildly drowsy which  is abnormal for him; although, he appears very alert.  OBJECTIVE Most recent Vital Signs: Filed Vitals:   09/18/12 1600 09/18/12 1857 09/19/12 0200 09/19/12 0600  BP:  110/75 131/56 132/56  Pulse:  77 60 60  Temp:  98.1 F (36.7 C) 97.4 F (36.3 C) 98.1 F (36.7 C)  TempSrc:  Oral Oral Oral  Resp: 18 18 18 18   Height:  5\' 9"  (1.753 m)    Weight:  64.9 kg (143 lb 1.3 oz)    SpO2:  100% 95% 98%   CBG (last 3)   Recent Labs  09/17/12 1258  GLUCAP 101*    IV Fluid Intake:   . sodium chloride 50 mL/hr at 09/18/12 1855    MEDICATIONS  . dexamethasone  4 mg Oral Q6H  . diphenhydrAMINE  50 mg Oral Once  . escitalopram  20 mg Oral Daily  . levETIRAcetam  500 mg Oral Daily  . ofloxacin  1 drop Both Eyes BID  . pantoprazole  40 mg Oral Daily   PRN:  HYDROcodone-acetaminophen  Diet:  General thin liquids Activity:  Bedrest with Bathroom privileges DVT Prophylaxis:  SCDs   CLINICALLY SIGNIFICANT STUDIES Basic Metabolic Panel:   Recent Labs Lab 09/17/12 1210  NA 136  K 4.1  CL 100  CO2 21  GLUCOSE 122*  BUN 21  CREATININE 1.20  CALCIUM 9.6   Liver Function Tests:   Recent Labs Lab 09/17/12 1210  AST 22  ALT 17  ALKPHOS 53  BILITOT 0.8  PROT 6.8  ALBUMIN 4.0   CBC:  Recent Labs Lab 09/17/12 1210  WBC 5.2  NEUTROABS 3.6  HGB 15.1  HCT 41.5  MCV 85.6  PLT 231   Coagulation:   Recent Labs Lab 09/17/12 1210  LABPROT 12.4  INR 0.93   Cardiac Enzymes:   Recent Labs Lab 09/17/12 1210  TROPONINI <0.30   Urinalysis:   Recent Labs Lab 09/17/12 1304  COLORURINE YELLOW  LABSPEC 1.016  PHURINE 7.5  GLUCOSEU NEGATIVE  HGBUR NEGATIVE  BILIRUBINUR NEGATIVE  KETONESUR 15*  PROTEINUR NEGATIVE  UROBILINOGEN 0.2  NITRITE NEGATIVE  LEUKOCYTESUR TRACE*   Lipid Panel No results found for this basename: chol,  trig,  hdl,  cholhdl,  vldl,  ldlcalc   HgbA1C  No results found for this basename: HGBA1C   Urine Drug Screen:   No results  found for this basename: labopia,  cocainscrnur,  labbenz,  amphetmu,  thcu,  labbarb    Alcohol Level: No results found for this basename: ETH,  in the last 168 hours  CT of the brain   09/17/2012   Focal hyperdensity seen in right parietal cortex superiorly consistent with intraparenchymal hemorrhage.  Given the large amount of subcortical white matter edema present, this may be related to underlying neoplasm or malignancy.  MRI scan is recommended for further evaluation.  Small amount of subarachnoid hemorrhage is seen involving the medial portion of the vertex of the right parietal cortex.   09/19/12  The dominant hematoma in the right parietal lobe is slightly  smaller in size but the margins are indistinct. No significant  change in the surrounding vasogenic edema. No significant change  in the subarachnoid blood near the right vertex.  MRI of the brain  09/17/2012   Right parietal lobe of 5 x 3 x 3 cm hematoma.  Adjacent 1.3 x 1 x 1.2 cm hematoma.  Marked surrounding vasogenic edema.  Local mass effect upon the right lateral ventricle which is displaced anteriorly and slightly inferiorly.  The cause of the hematoma is indeterminate.  This may represent changes of congophillic/amyloid angiopathy.  Result of cortical vein thrombosis is a possibility.  The adjacent major dural sinuses appear patent.  There is mild peripheral enhancement involving portions of the hematoma but without well-defined mass separate from hematoma. This will need to be further evaluated on follow-up imaging  to confirm that the hematoma clears as expected with a simple hematoma and rule out underlying mass not appreciated on the present exam.  There are prominent vessels in the region of the right parietal lobe hematoma which may represent compressed cortical vessels rather than vascular malformation.  Attention to this possibility on follow-up.     MRA of the brain    CT Chest - No evidence of primary malignancy or  metastatic disease within the chest.  CT Abdomen and Pelvis -  1. No primary malignancy is identified within the abdomen or pelvis.  2. Hepatic heterogeneity with multiple indeterminate small low-  density lesions. These could reflect metastases.  3. Mild bilateral adrenal nodularity.  4. Mildly complex cystic lesion in the upper pole of the right  kidney is unlikely to account for the intracranial abnormality.  Atrophy and cortical scarring of the left kidney.   EKG  normal EKG, normal sinus rhythm.   Therapy Recommendations pending  Physical Exam   Pleasant healthy-looking middle-aged Caucasian male currently not in distress.Awake alert. Afebrile. Head is nontraumatic. Neck is supple without bruit. Hearing is normal. Cardiac exam no murmur or gallop. Lungs are clear to  auscultation.   Neurological Exam : Awake alert oriented x3 with normal speech and language.  Extraocular movements are full range without nystagmus. Fundi were not visualized. Pupils equal reactive. Face is symmetric without weakness tongue is midline. Motor system exam reveals no upper or lower extremity drift. Strength 5 over 5. Sensation intact. Finger to nose slow but intact. Gait stable, narrow based.   ASSESSMENT Mr. 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  Imaging confirms a right parietal hemorrhage with significant vasogenic edema, ? tumor. Differential:  Primary tumor with hemorrhage within tumor, metastatic tumor with hemorrhage, or hemorrhagic infarct.  On no antiplatelets prior to admission.  Patient with resultant very mild left hemiparesis. Work up underway.   Anxiety, on ativan and lexapro PTA  Recent cataract extraction  Hospital day # 2  TREATMENT/PLAN   CT chest, abd and pelvis - possible hepatic metastatic lesions.  ESR - 4 - within normal limits.  CT head in am - pending.  Continue keppra  Agree with Decadron  Will order  physical and occupational therapy evaluations as recommended. The patient appears to be a bit impulsive.  Delton See PA-C [facility] Pager [phone] 09/19/2012, 8:38 AM  I examined patient and reviewed note, and agree with plan. No focal defecits. Continue levetiracetam for seizure prevention. Further workup per Dr. Jeral Fruit. May need brain biopsy or liver biopsy for definitive diagnosis.   Suanne Marker, MD 09/19/2012, 12:47 PM Certified in Neurology, Neurophysiology and Neuroimaging  [facility] [address] [place], [place] [address] [phone]

## 2012-09-20 NOTE — Progress Notes (Signed)
Stroke Team Progress Note  HISTORY Brent Webb is an 78 y.o. male with a past medical history significant for anxiety, transferred from [facility] for further evaluation of hemorrhagic stroke. He was in his usual state of heath until yesterday morning 09/17/2012 when developed sudden onset of " tightness and convulsive movements of the left leg, a numb sensation around my left hip, and later on some numbness of my face". He said that he never had similar symptoms before. Brent Webb indicated that he couldn't control the involuntary movements of the left leg which lasted for a couple of minutes. He tells me that he noticed some weakness of the left leg and arm which is now resolved. No associated headache, vertigo, double vision, difficulty swallowing, slurred speech, language or vision impairment. Denies recent fever, infection, or head trauma. No taking anticoagulants or antiplatelets.   He was taken by ambulance to [facility] where he had a CT brain that showed an area of focal hyperdensity in the right parietal cortex superiorly consistent with intraparenchymal hemorrhage as well as marked amount of subcortical white matter edema present, concerning for an underlying neoplasm or malignancy. A subsequent MRI brain with and without contrast revealed a right parietal lobe 5 x 3 x 3 cm hematoma with an adjacent 1.3 x 1 x 1.2 cm hematoma. Marked surrounding vasogenic edema, and mild enhancement. Local mass effect upon the right lateral ventricle which is displaced anteriorly and slightly inferiorly.  He received 1 gram IV keppra and 8 mg IV decadron at [facility] ED.  Stressed the fact that a couple of weeks ago he and his wife noticed that " I was acting confused".  Patient was not a TPA candidate secondary to hemmorrhage. He was admitted to the neuro ICU for further evaluation and treatment.  SUBJECTIVE There no family members in room this morning. The patient has no complaints. He was performed some exercise  routines when I entered his room. He is very active.  OBJECTIVE Most recent Vital Signs: Filed Vitals:   09/19/12 2200 09/20/12 0100 09/20/12 0200 09/20/12 0600  BP: 122/60 125/44  124/64  Pulse: 66  62 56  Temp: 97.6 F (36.4 C)  97.7 F (36.5 C) 97.8 F (36.6 C)  TempSrc:      Resp: 16  16 16   Height:      Weight:      SpO2: 97% 97%  97%   CBG (last 3)   Recent Labs  09/17/12 1258  GLUCAP 101*    IV Fluid Intake:   . sodium chloride 50 mL/hr at 09/18/12 1855    MEDICATIONS  . dexamethasone  4 mg Oral Q6H  . diphenhydrAMINE  50 mg Oral Once  . levETIRAcetam  500 mg Oral Daily  . ofloxacin  1 drop Both Eyes BID  . pantoprazole  40 mg Oral Daily   PRN:  HYDROcodone-acetaminophen  Diet:  General thin liquids Activity:  Bedrest with Bathroom privileges DVT Prophylaxis:  SCDs   CLINICALLY SIGNIFICANT STUDIES Basic Metabolic Panel:   Recent Labs Lab 09/17/12 1210  NA 136  K 4.1  CL 100  CO2 21  GLUCOSE 122*  BUN 21  CREATININE 1.20  CALCIUM 9.6   Liver Function Tests:   Recent Labs Lab 09/17/12 1210  AST 22  ALT 17  ALKPHOS 53  BILITOT 0.8  PROT 6.8  ALBUMIN 4.0   CBC:   Recent Labs Lab 09/17/12 1210  WBC 5.2  NEUTROABS 3.6  HGB 15.1  HCT 41.5  MCV 85.6  PLT 231   Coagulation:   Recent Labs Lab 09/17/12 1210  LABPROT 12.4  INR 0.93   Cardiac Enzymes:   Recent Labs Lab 09/17/12 1210  TROPONINI <0.30   Urinalysis:   Recent Labs Lab 09/17/12 1304  COLORURINE YELLOW  LABSPEC 1.016  PHURINE 7.5  GLUCOSEU NEGATIVE  HGBUR NEGATIVE  BILIRUBINUR NEGATIVE  KETONESUR 15*  PROTEINUR NEGATIVE  UROBILINOGEN 0.2  NITRITE NEGATIVE  LEUKOCYTESUR TRACE*   Lipid Panel No results found for this basename: chol,  trig,  hdl,  cholhdl,  vldl,  ldlcalc   HgbA1C  No results found for this basename: HGBA1C   Urine Drug Screen:   No results found for this basename: labopia,  cocainscrnur,  labbenz,  amphetmu,  thcu,  labbarb     Alcohol Level: No results found for this basename: ETH,  in the last 168 hours  CT of the brain   09/17/2012   Focal hyperdensity seen in right parietal cortex superiorly consistent with intraparenchymal hemorrhage.  Given the large amount of subcortical white matter edema present, this may be related to underlying neoplasm or malignancy.  MRI scan is recommended for further evaluation.  Small amount of subarachnoid hemorrhage is seen involving the medial portion of the vertex of the right parietal cortex.   09/19/12  The dominant hematoma in the right parietal lobe is slightly  smaller in size but the margins are indistinct. No significant  change in the surrounding vasogenic edema. No significant change  in the subarachnoid blood near the right vertex.  MRI of the brain  09/17/2012   Right parietal lobe of 5 x 3 x 3 cm hematoma.  Adjacent 1.3 x 1 x 1.2 cm hematoma.  Marked surrounding vasogenic edema.  Local mass effect upon the right lateral ventricle which is displaced anteriorly and slightly inferiorly.  The cause of the hematoma is indeterminate.  This may represent changes of congophillic/amyloid angiopathy.  Result of cortical vein thrombosis is a possibility.  The adjacent major dural sinuses appear patent.  There is mild peripheral enhancement involving portions of the hematoma but without well-defined mass separate from hematoma. This will need to be further evaluated on follow-up imaging  to confirm that the hematoma clears as expected with a simple hematoma and rule out underlying mass not appreciated on the present exam.  There are prominent vessels in the region of the right parietal lobe hematoma which may represent compressed cortical vessels rather than vascular malformation.  Attention to this possibility on follow-up.     MRA of the brain    CT Chest - No evidence of primary malignancy or metastatic disease within the chest.  CT Abdomen and Pelvis -  1. No primary malignancy is  identified within the abdomen or pelvis.  2. Hepatic heterogeneity with multiple indeterminate small low-  density lesions. These could reflect metastases.  3. Mild bilateral adrenal nodularity.  4. Mildly complex cystic lesion in the upper pole of the right  kidney is unlikely to account for the intracranial abnormality.  Atrophy and cortical scarring of the left kidney.   EKG  normal EKG, normal sinus rhythm.   Therapy Recommendations -  Outpatient physical therapy with intermittent supervision recommended.  Physical Exam   Pleasant healthy-looking middle-aged Caucasian male currently not in distress.Awake alert. Afebrile. Head is nontraumatic. Neck is supple without bruit. Hearing is normal. Cardiac exam no murmur or gallop. Lungs are clear to auscultation.   Neurological Exam : Awake  alert oriented x3 with normal speech and language.  Extraocular movements are full range without nystagmus. Fundi were not visualized. Pupils equal reactive. Face is symmetric without weakness tongue is midline. Motor system exam reveals SUBTLE LUE DRIFT WITH DISTRACTION, AND MILD ORBITING OF RUE OVER LUE. INDIVIDUAL MUSCLE TESTING IS 5/5 IN ALL GROUPS. Sensation intact. Finger to nose slow but intact. Gait stable, narrow based.   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  Imaging confirms a right parietal hemorrhage with significant vasogenic edema, ? tumor. Differential:  Primary tumor with hemorrhage within tumor, metastatic tumor with hemorrhage, or hemorrhagic infarct.  On no antiplatelets prior to admission.  Patient with resultant very mild left hemiparesis. Work up underway.   Anxiety, on ativan and lexapro PTA  Recent cataract extraction  Hospital day # 3  TREATMENT/PLAN   CT chest, abd and pelvis - possible hepatic metastatic lesions.  ESR - 4 - within normal limits.  CT head 09/19/12 - see above - no significant  change.  Continue keppra  Agree with Decadron  Outpatient physical therapy with intermittent supervision has been recommended  A followup MRI of the head is planned per neurosurgery.  Delton See PA-C [facility] Pager [phone] 09/20/2012, 8:04 AM  I examined patient and reviewed note, and agree with plan. No focal defecits. Continue levetiracetam for seizure prevention. Further workup per Dr. Jeral Fruit. May need brain biopsy or liver biopsy for definitive diagnosis. I updated patient, son and wife at bedside today.   Suanne Marker, MD 09/20/2012, 12:21 PM Certified in Neurology, Neurophysiology and Neuroimaging  [facility] [address] [place], [place] [address] [phone]

## 2012-09-20 NOTE — Progress Notes (Signed)
Occupational Therapy Evaluation Patient Details Name: Brent Webb MRN: [id number] DOB: 1934-12-22 Today's Date: 09/20/2012 Time: 9562-1308 OT Time Calculation (min): 25 min  OT Assessment / Plan / Recommendation Clinical Impression  78 yo s/p recent cataract surgery admitted with c/o L side sensory changes and "convulsive" movments.Pt with R parietal hematoma suspicious for underlying neoplasm. Wife states pt had been having difficulty with getting glost when driving, confusing R/L and poor spatial awareness (scraped car door because of driving into garage close to wall). Pt demonstrtes deficits with perception, especially with spatial awareness and organized scanning patterns. Pt's errors primarily in L field. Plan to see an additional session to discuss activities to increase spatial awareness and safety. will need to follow up with OT at neuro outpt center.    OT Assessment  Patient needs continued OT Services    Follow Up Recommendations  Outpatient OT;Other (comment) (neuro outpt)    Barriers to Discharge None    Equipment Recommendations  None recommended by OT    Recommendations for Other Services    Frequency  Min 2X/week    Precautions / Restrictions Precautions Precautions: Other (comment) Precaution Comments: visual spatial deficits   Pertinent Vitals/Pain no apparent distress     ADL  Eating/Feeding: Independent Where Assessed - Eating/Feeding: Chair Grooming: Independent Where Assessed - Grooming: Unsupported standing Upper Body Bathing: Independent Where Assessed - Upper Body Bathing: Unsupported sitting Lower Body Bathing: Independent Where Assessed - Lower Body Bathing: Unsupported sit to stand Upper Body Dressing: Independent Where Assessed - Upper Body Dressing: Unsupported sitting Lower Body Dressing: Independent Where Assessed - Lower Body Dressing: Unsupported sit to stand Toilet Transfer: Independent Toilet Transfer Method: Sit to Teacher, music: Comfort height toilet Toileting - Clothing Manipulation and Hygiene: Independent Where Assessed - Toileting Clothing Manipulation and Hygiene: Sit to stand from 3-in-1 or toilet Transfers/Ambulation Related to ADLs: independent    OT Diagnosis: Disturbance of vision;Other (comment) (perceptual deficit)  OT Problem List: Impaired vision/perception OT Treatment Interventions: Therapeutic activities;Visual/perceptual remediation/compensation;Patient/family education   OT Goals Acute Rehab OT Goals OT Goal Formulation: With patient Time For Goal Achievement: 09/27/12 Potential to Achieve Goals: Good Miscellaneous OT Goals Miscellaneous OT Goal #1: Pt/family will demonstrate understanding of visual scanning activites and activites addressing spatial organization al skills. OT Goal: Miscellaneous Goal #1 - Progress: Goal set today Miscellaneous OT Goal #2: Pt/family will demonstrate importance of refraining from drinving and understand other safety concerns. OT Goal: Miscellaneous Goal #2 - Progress: Goal set today  Visit Information  Last OT Received On: 09/20/12 Assistance Needed: +1    Subjective Data      Prior Functioning     Home Living Lives With: Spouse Available Help at Discharge: Family;Available 24 hours/day Type of Home: House Home Access: Stairs to enter [employer] of Steps: 4 Entrance Stairs-Rails: Right Home Layout: Two level;Able to live on main level with bedroom/bathroom Bathroom Shower/Tub: Walk-in shower;Door Dentist: None Prior Function Level of Independence: Independent Able to Take Stairs?: Yes Driving: Yes Vocation: Part time employment Communication Communication: No difficulties Dominant Hand: Right         Vision/Perception Vision - History Baseline Vision: Other (comment) Visual History: Cataracts;Other (comment);Corrective eye surgery (recent eye surgery) Patient  Visual Report: No change from baseline Vision - Assessment Eye Alignment: Within Functional Limits Vision Assessment: Vision tested Ocular Range of Motion: Within Functional Limits Alignment/Gaze Preference: Within Defined Limits Tracking/Visual Pursuits: Able to track stimulus in all quads without difficulty  Saccades: Within functional limits Convergence: Within functional limits Visual Fields: Impaired - to be further tested in functional context Additional Comments: Pt with decreased awareness L field during double simultaneous stimuli. will further assess Perception Perception: Impaired (will further assess. Missing targets primarily in L field) Inattention/Neglect: Impaired-to be further tested in functional context (decrased resopnse to left) Spatial Orientation: poor organized scanning patterns Praxis Praxis: Intact   Cognition  Cognition Overall Cognitive Status: Impaired Area of Impairment: Memory;Other (comment) (? perseverating? stress of unknown diagnosis) Arousal/Alertness: Awake/alert Orientation Level: Oriented X4 / Intact Behavior During Session: WFL for tasks performed Memory: Decreased recall of precautions Memory Deficits: repeating self throughout eval    Extremity/Trunk Assessment Right Upper Extremity Assessment RUE ROM/Strength/Tone: [facility] for tasks assessed;Within functional levels RUE Sensation: WFL - Light Touch;WFL - Proprioception RUE Coordination: WFL - gross/fine motor Left Upper Extremity Assessment LUE ROM/Strength/Tone: Within functional levels LUE Sensation: WFL - Light Touch;WFL - Proprioception LUE Coordination: WFL - gross/fine motor Right Lower Extremity Assessment RLE ROM/Strength/Tone: Within functional levels RLE Sensation: WFL - Light Touch RLE Coordination: WFL - gross/fine motor Left Lower Extremity Assessment LLE ROM/Strength/Tone: Within functional levels LLE Sensation: WFL - Light Touch LLE Coordination: WFL - gross/fine  motor Trunk Assessment Trunk Assessment: Normal     Mobility Bed Mobility Bed Mobility: Supine to Sit Supine to Sit: 7: Independent Transfers Sit to Stand: 7: Independent Stand to Sit: 7: Independent     Exercise     Balance  modI   End of Session OT - End of Session Activity Tolerance: Patient tolerated treatment well Patient left: in chair;with call bell/phone within reach;with family/visitor present Nurse Communication: Mobility status  GO     Casy Brunetto,HILLARY 09/20/2012, 5:12 PM [facility], OTR/L  [phone] 09/20/2012

## 2012-09-20 NOTE — Progress Notes (Signed)
Occupational Therapy Treatment Patient Details Name: Brent Webb MRN: [id number] DOB: 1934/08/27 Today's Date: 09/20/2012 Time: 1191-4782 OT Time Calculation (min): 23 min  OT Assessment / Plan / Recommendation Comments on Treatment Session Completed education regarding perceptual concerns. Pt given several activites to work on while hospitalized. Pt/wife verbalized understanding on how perceptual deficits affect safety and have agreed to refrain from driving, operating power tools and avoid cooking. Pt will need to follow up at the neuro outpt center with OT.    Follow Up Recommendations  Outpatient OT    Barriers to Discharge  None    Equipment Recommendations  None recommended by OT    Recommendations for Other Services    Frequency Min 2X/week   Plan Discharge plan remains appropriate    Precautions / Restrictions Precautions Precautions: Other (comment) Precaution Comments: visual spatial deficits   Pertinent Vitals/Pain no apparent distress     ADL  Eating/Feeding: Independent Where Assessed - Eating/Feeding: Chair Grooming: Independent Where Assessed - Grooming: Unsupported standing Upper Body Bathing: Independent Where Assessed - Upper Body Bathing: Unsupported sitting Lower Body Bathing: Independent Where Assessed - Lower Body Bathing: Unsupported sit to stand Upper Body Dressing: Independent Where Assessed - Upper Body Dressing: Unsupported sitting Lower Body Dressing: Independent Where Assessed - Lower Body Dressing: Unsupported sit to stand Toilet Transfer: Independent Toilet Transfer Method: Sit to Barista: Comfort height toilet Toileting - Clothing Manipulation and Hygiene: Independent Where Assessed - Engineer, mining and Hygiene: Sit to stand from 3-in-1 or toilet Transfers/Ambulation Related to ADLs: independent ADL Comments: focus of session on educating pt/family regarding visual spatial scanning patterns, use of  anchors and compensatory techniques. Ptwith improved performance wihen pace is slowly and distraction is minimized.  Noted R/L discriimination difficulties    OT Diagnosis: Disturbance of vision;Other (comment) (perceptual deficit)  OT Problem List: Impaired vision/perception OT Treatment Interventions: Therapeutic activities;Visual/perceptual remediation/compensation;Patient/family education   OT Goals Acute Rehab OT Goals OT Goal Formulation: With patient Time For Goal Achievement: 09/27/12 Potential to Achieve Goals: Good Miscellaneous OT Goals Miscellaneous OT Goal #1: Pt/family will demonstrate understanding of visual scanning activites and activites addressing spatial organization al skills. OT Goal: Miscellaneous Goal #1 - Progress: Met Miscellaneous OT Goal #2: Pt/family will demonstrate importance of refraining from drinving and understand other safety concerns. OT Goal: Miscellaneous Goal #2 - Progress: Met  Visit Information  Last OT Received On: 09/20/12 Assistance Needed: +1    Subjective Data      Prior Functioning  Home Living Lives With: Spouse Available Help at Discharge: Family;Available 24 hours/day Type of Home: House Home Access: Stairs to enter [employer] of Steps: 4 Entrance Stairs-Rails: Right Home Layout: Two level;Able to live on main level with bedroom/bathroom Bathroom Shower/Tub: Walk-in shower;Door Dentist: None Prior Function Level of Independence: Independent Able to Take Stairs?: Yes Driving: Yes Vocation: Part time employment Communication Communication: No difficulties Dominant Hand: Right    Cognition  Cognition Overall Cognitive Status: Impaired Area of Impairment: Memory;Other (comment) (? perseverating? stress of unknown diagnosis) Arousal/Alertness: Awake/alert Orientation Level: Oriented X4 / Intact Behavior During Session: WFL for tasks performed Memory: Decreased recall  of precautions Memory Deficits: repeating self throughout eval Cognition - Other Comments: Baseline cognition. ?stress of illness exacerbating any cognitive deficits.    Mobility  Bed Mobility Bed Mobility: Supine to Sit Supine to Sit: 7: Independent Transfers Sit to Stand: 7: Independent Stand to Sit: 7: Independent    Exercises  Other  Exercises Other Exercises: visual scanning activites Other Exercises: spatial awaerness tasks Other Exercises: figure ground activites   Balance     End of Session OT - End of Session Activity Tolerance: Patient tolerated treatment well Patient left: Other (comment) (standing in room with family) Nurse Communication: Mobility status;Other (comment) (perceptual dificulties)  GO     Huston Stonehocker,HILLARY 09/20/2012, 5:23 PM [facility], OTR/L  [phone] 09/20/2012

## 2012-09-20 NOTE — Progress Notes (Signed)
Subjective: Patient reports Offers no complaints feels well  Objective: Vital signs in last 24 hours: Temp:  [97.3 F (36.3 C)-97.8 F (36.6 C)] 97.8 F (36.6 C) (04/13 0600) Pulse Rate:  [56-72] 56 (04/13 0600) Resp:  [16-18] 16 (04/13 0600) BP: (122-127)/(44-65) 124/64 mmHg (04/13 0600) SpO2:  [97 %-100 %] 97 % (04/13 0600)  Intake/Output from previous day: 04/12 0701 - 04/13 0700 In: 440 [P.O.:440] Out: -  Intake/Output this shift: Total I/O In: 320 [P.O.:320] Out: -   Motor function is normal station and gait are normal no focal deficits  Lab Results: No results found for this basename: WBC, HGB, HCT, PLT,  in the last 72 hours BMET No results found for this basename: NA, K, CL, CO2, GLUCOSE, BUN, CREATININE, CALCIUM,  in the last 72 hours  Studies/Results: Ct Head Wo Contrast  09/19/2012  *RADIOLOGY REPORT*  Clinical Data: Follow up intracranial hematoma.  CT HEAD WITHOUT CONTRAST  Technique:  Contiguous axial images were obtained from the base of the skull through the vertex without contrast.  Comparison: Head CT 09/17/2012 and brain MRI 09/17/2012  Findings: The dominant hematoma in the right parietal lobe has indistinct borders.  The hematoma roughly measures 2.1 x 1.5 cm on image 21 and previously measured 2.3 x 1.6 cm.  The adjacent small hematoma seen on the MRI is poorly characterized on this examination.  There is no significant change in the vasogenic edema.  There is stable subarachnoid blood near the right vertex. There is no evidence for a new hemorrhage.  No significant midline shift or hydrocephalus. No acute bony abnormality.  IMPRESSION: The dominant hematoma in the right parietal lobe is slightly smaller in size but the margins are indistinct.  No significant change in the surrounding vasogenic edema.  No significant change in the subarachnoid blood near the right vertex.   Original Report Authenticated By: Richarda Overlie, M.D.    Ct Chest W Contrast  09/18/2012   *RADIOLOGY REPORT*  Clinical Data:  Right parietal brain mass.  Evaluate for primary malignancy or metastases.  CT CHEST, ABDOMEN AND PELVIS WITH CONTRAST  Technique:  Multidetector CT imaging of the chest, abdomen and pelvis was performed following the standard protocol during bolus administration of intravenous contrast.  Contrast: OMNIPAQUE IOHEXOL 300 MG/ML  SOLN  Comparison:  MRI brain 09/17/2012.  Chest radiographs 02/28/2011.  CT CHEST  Findings:  There are no enlarged mediastinal, hilar or axillary lymph nodes.  There is minimal atherosclerosis of the aorta and coronary arteries.  There is no pleural or pericardial effusion.  Mild emphysematous changes are present.  No primary malignancy is identified within the chest.  There is a focal 5 mm ground-glass opacity in the right lower lobe on image 54, unlikely be clinically significant.  Nodular thickening along the left major fissure on image 37 is probably due to an intrapulmonary lymph node.  There are no worrisome osseous findings.  IMPRESSION: No evidence of primary malignancy or metastatic disease within the chest.  CT ABDOMEN AND PELVIS  Findings:  The hepatic parenchyma is heterogeneous with multiple small low-density lesions.  The largest of these is situated superiorly in the lateral segment of the left hepatic lobe, measuring 1.3 cm on image 52.  This demonstrates peripheral enhancement and could reflect a hemangioma.  The other lesions are too small to characterize.  Both adrenal glands demonstrate mild nodularity without dominant mass.  The spleen, pancreas and gallbladder appear normal.  There are right renal cystic lesions,  the largest in the upper pole measuring 4.1 cm on image 67.  This appears mildly complex.  However, no solid lesion is identified.  The left kidney is diffusely atrophied with cortical scarring and caliectasis. There are small left renal calculi.  No bowel lesions are identified.  There is moderate descending and sigmoid  colon diverticulosis.  The appendix appears normal.  There are no enlarged abdominal pelvic lymph nodes.  The urinary bladder appears normal.  The prostate gland is mildly enlarged.  Degenerative changes are present throughout the spine.  There are no worrisome osseous findings.  IMPRESSION:  1.  No primary malignancy is identified within the abdomen or pelvis. 2.  Hepatic heterogeneity with multiple indeterminate small low- density lesions. These could reflect metastases. 3.  Mild bilateral adrenal nodularity. 4.  Mildly complex cystic lesion in the upper pole of the right kidney is unlikely to account for the intracranial abnormality. Atrophy and cortical scarring of the left kidney.   Original Report Authenticated By: Carey Bullocks, M.D.    Ct Abdomen Pelvis W Contrast  09/18/2012  *RADIOLOGY REPORT*  Clinical Data:  Right parietal brain mass.  Evaluate for primary malignancy or metastases.  CT CHEST, ABDOMEN AND PELVIS WITH CONTRAST  Technique:  Multidetector CT imaging of the chest, abdomen and pelvis was performed following the standard protocol during bolus administration of intravenous contrast.  Contrast: OMNIPAQUE IOHEXOL 300 MG/ML  SOLN  Comparison:  MRI brain 09/17/2012.  Chest radiographs 02/28/2011.  CT CHEST  Findings:  There are no enlarged mediastinal, hilar or axillary lymph nodes.  There is minimal atherosclerosis of the aorta and coronary arteries.  There is no pleural or pericardial effusion.  Mild emphysematous changes are present.  No primary malignancy is identified within the chest.  There is a focal 5 mm ground-glass opacity in the right lower lobe on image 54, unlikely be clinically significant.  Nodular thickening along the left major fissure on image 37 is probably due to an intrapulmonary lymph node.  There are no worrisome osseous findings.  IMPRESSION: No evidence of primary malignancy or metastatic disease within the chest.  CT ABDOMEN AND PELVIS  Findings:  The hepatic  parenchyma is heterogeneous with multiple small low-density lesions.  The largest of these is situated superiorly in the lateral segment of the left hepatic lobe, measuring 1.3 cm on image 52.  This demonstrates peripheral enhancement and could reflect a hemangioma.  The other lesions are too small to characterize.  Both adrenal glands demonstrate mild nodularity without dominant mass.  The spleen, pancreas and gallbladder appear normal.  There are right renal cystic lesions, the largest in the upper pole measuring 4.1 cm on image 67.  This appears mildly complex.  However, no solid lesion is identified.  The left kidney is diffusely atrophied with cortical scarring and caliectasis. There are small left renal calculi.  No bowel lesions are identified.  There is moderate descending and sigmoid colon diverticulosis.  The appendix appears normal.  There are no enlarged abdominal pelvic lymph nodes.  The urinary bladder appears normal.  The prostate gland is mildly enlarged.  Degenerative changes are present throughout the spine.  There are no worrisome osseous findings.  IMPRESSION:  1.  No primary malignancy is identified within the abdomen or pelvis. 2.  Hepatic heterogeneity with multiple indeterminate small low- density lesions. These could reflect metastases. 3.  Mild bilateral adrenal nodularity. 4.  Mildly complex cystic lesion in the upper pole of the right kidney is  unlikely to account for the intracranial abnormality. Atrophy and cortical scarring of the left kidney.   Original Report Authenticated By: Carey Bullocks, M.D.     Assessment/Plan: Awaiting repeat MRI per Dr. Jeral Fruit  LOS: 3 days  Possible biopsy or findings of MRI   Brent Webb J 09/20/2012, 12:52 PM

## 2012-09-21 ENCOUNTER — Inpatient Hospital Stay (HOSPITAL_COMMUNITY): Payer: Medicare Other

## 2012-09-21 NOTE — Progress Notes (Signed)
Patient asking if he can go home this pm.  Called Dr. Cassandria Santee secretary who said she would pass the message on to him.  Lance Bosch, RN

## 2012-09-21 NOTE — Progress Notes (Signed)
Patient dc education complete and paperwork given.  Prescriptions given to patient by Dr. Jeral Fruit.  IV removed and tech wheeling patient to main entrance.  Lance Bosch, RN

## 2012-09-21 NOTE — Progress Notes (Signed)
Stroke Team Progress Note  HISTORY Lonny Eisen is an 78 y.o. male with a past medical history significant for anxiety, transferred from [facility] center for further evaluation of hemorrhagic stroke. He was in his usual state of heath until yesterday morning 09/17/2012 when developed sudden onset of " tightness and convulsive movements of the left leg, a numb sensation around my left hip, and later on some numbness of my face". He said that he never had similar symptoms before. Mr. Mejorado indicated that he couldn't control the involuntary movements of the left leg which lasted for a couple of minutes. He tells me that he noticed some weakness of the left leg and arm which is now resolved. No associated headache, vertigo, double vision, difficulty swallowing, slurred speech, language or vision impairment. Denies recent fever, infection, or head trauma. No taking anticoagulants or antiplatelets.   He was taken by ambulance to [facility] center where he had a CT brain that showed an area of focal hyperdensity in the right parietal cortex superiorly consistent with intraparenchymal hemorrhage as well as marked amount of subcortical white matter edema present, concerning for an underlying neoplasm or malignancy. A subsequent MRI brain with and without contrast revealed a right parietal lobe 5 x 3 x 3 cm hematoma with an adjacent 1.3 x 1 x 1.2 cm hematoma. Marked surrounding vasogenic edema, and mild enhancement. Local mass effect upon the right lateral ventricle which is displaced anteriorly and slightly inferiorly.  He received 1 gram IV keppra and 8 mg IV decadron at [facility] ED.  Stressed the fact that a couple of weeks ago he and his wife noticed that " I was acting confused".  Patient was not a TPA candidate secondary to hemmorrhage. He was admitted to the neuro ICU for further evaluation and treatment.  SUBJECTIVE Patient up in halls, working with PT.   OBJECTIVE Most recent Vital Signs: Filed Vitals:   09/20/12 0600  09/20/12 1401 09/20/12 2139 09/21/12 0600  BP: 124/64 109/51 161/66 152/74  Pulse: 56 63 51 53  Temp: 97.8 F (36.6 C) 97.4 F (36.3 C) 97.5 F (36.4 C) 97.6 F (36.4 C)  TempSrc:  Oral    Resp: 16 18 16 16   Height:      Weight:      SpO2: 97% 98% 98% 99%   CBG (last 3)  No results found for this basename: GLUCAP,  in the last 72 hours  IV Fluid Intake:   . sodium chloride 50 mL/hr at 09/18/12 1855   MEDICATIONS  . dexamethasone  4 mg Oral Q6H  . diphenhydrAMINE  50 mg Oral Once  . levETIRAcetam  500 mg Oral Daily  . ofloxacin  1 drop Both Eyes BID  . pantoprazole  40 mg Oral Daily   PRN:  HYDROcodone-acetaminophen  Diet:  General thin liquids Activity:  Bedrest with Bathroom privileges DVT Prophylaxis:  SCDs   CLINICALLY SIGNIFICANT STUDIES Basic Metabolic Panel:   Recent Labs Lab 09/17/12 1210  NA 136  K 4.1  CL 100  CO2 21  GLUCOSE 122*  BUN 21  CREATININE 1.20  CALCIUM 9.6   Liver Function Tests:   Recent Labs Lab 09/17/12 1210  AST 22  ALT 17  ALKPHOS 53  BILITOT 0.8  PROT 6.8  ALBUMIN 4.0   CBC:   Recent Labs Lab 09/17/12 1210  WBC 5.2  NEUTROABS 3.6  HGB 15.1  HCT 41.5  MCV 85.6  PLT 231   Coagulation:   Recent Labs  Lab 09/17/12 1210  LABPROT 12.4  INR 0.93   Cardiac Enzymes:   Recent Labs Lab 09/17/12 1210  TROPONINI <0.30   Urinalysis:   Recent Labs Lab 09/17/12 1304  COLORURINE YELLOW  LABSPEC 1.016  PHURINE 7.5  GLUCOSEU NEGATIVE  HGBUR NEGATIVE  BILIRUBINUR NEGATIVE  KETONESUR 15*  PROTEINUR NEGATIVE  UROBILINOGEN 0.2  NITRITE NEGATIVE  LEUKOCYTESUR TRACE*   Lipid Panel No results found for this basename: chol,  trig,  hdl,  cholhdl,  vldl,  ldlcalc   HgbA1C  No results found for this basename: HGBA1C   Urine Drug Screen:   No results found for this basename: labopia,  cocainscrnur,  labbenz,  amphetmu,  thcu,  labbarb    Alcohol Level: No results found for this basename: ETH,  in the last 168  hours  CT of the brain   09/19/12  The dominant hematoma in the right parietal lobe is slightly smaller in size but the margins are indistinct. No significant change in the surrounding vasogenic edema. No significant change in the subarachnoid blood near the right vertex. 09/17/2012   Focal hyperdensity seen in right parietal cortex superiorly consistent with intraparenchymal hemorrhage.  Given the large amount of subcortical white matter edema present, this may be related to underlying neoplasm or malignancy.  MRI scan is recommended for further evaluation.  Small amount of subarachnoid hemorrhage is seen involving the medial portion of the vertex of the right parietal cortex.   MRI of the brain  09/17/2012   Right parietal lobe of 5 x 3 x 3 cm hematoma.  Adjacent 1.3 x 1 x 1.2 cm hematoma.  Marked surrounding vasogenic edema.  Local mass effect upon the right lateral ventricle which is displaced anteriorly and slightly inferiorly.  The cause of the hematoma is indeterminate.  This may represent changes of congophillic/amyloid angiopathy.  Result of cortical vein thrombosis is a possibility.  The adjacent major dural sinuses appear patent.  There is mild peripheral enhancement involving portions of the hematoma but without well-defined mass separate from hematoma. This will need to be further evaluated on follow-up imaging  to confirm that the hematoma clears as expected with a simple hematoma and rule out underlying mass not appreciated on the present exam.  There are prominent vessels in the region of the right parietal lobe hematoma which may represent compressed cortical vessels rather than vascular malformation.  Attention to this possibility on follow-up.     CT Chest - No evidence of primary malignancy or metastatic disease within the chest.  CT Abdomen and Pelvis -  1. No primary malignancy is identified within the abdomen or pelvis.  2. Hepatic heterogeneity with multiple indeterminate small low-  density lesions. These could reflect metastases.  3. Mild bilateral adrenal nodularity.  4. Mildly complex cystic lesion in the upper pole of the right  kidney is unlikely to account for the intracranial abnormality.  Atrophy and cortical scarring of the left kidney.  EKG  normal EKG, normal sinus rhythm.   Therapy Recommendations -  Outpatient physical therapy with intermittent supervision recommended.  Physical Exam  General: The patient is alert and cooperative at the time of the examination.  Skin: No significant peripheral edema is noted.   Neurologic Exam  Cranial nerves: Facial symmetry is present. Speech is normal, no aphasia or dysarthria is noted. Extraocular movements are full. Visual fields are full.  Motor: The patient has good strength in all 4 extremities.  Coordination: The patient has good finger-nose-finger and  heel-to-shin bilaterally.  Gait and station: The patient has a normal gait. Romberg is negative. No drift is seen.  Reflexes: Deep tendon reflexes are symmetric.   ASSESSMENT Mr. 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  Imaging confirms a right parietal hemorrhage with significant vasogenic edema, ? tumor. Differential:  Primary tumor with hemorrhage within tumor, metastatic tumor with hemorrhage, or hemorrhagic infarct.  CT chest, abd and pelvis - possible hepatic metastatic lesions. On no antiplatelets prior to admission.  Patient with resultant very mild left hemiparesis for which OP therapy was recommended   Anxiety, on ativan and lexapro PTA  Recent cataract extraction  ESR - 4 - within normal limits.  Hospital day # 4  TREATMENT/PLAN   Continue keppra and Decadron  Outpatient physical therapy with intermittent supervision has been recommended  CT of the brain scheduled tomorrow by NS  Plans for discharge tomorrow with office follow up by [facility] No further stroke workup  indicated. Stroke Service will sign off. Please call should any needs arise.  Annie Main, MSN, RN, ANVP-BC, ANP-BC, [facility] Pager: [phone] 09/21/2012 11:20 AM  I have personally obtained a history, examined the patient, evaluated imaging results, and formulated the assessment and plan of care. I agree with the above.  Lesly Dukes

## 2012-09-21 NOTE — Discharge Summary (Signed)
Physician Discharge Summary  Patient ID: Brent Webb MRN: [id number] DOB/AGE: [birth year] 77 y.o.  Admit date: 09/17/2012 Discharge date: 09/21/2012  Admission Diagnoses:lesion right parietal lobe r/o tumor vs stroke  Discharge Diagnoses: same Active Problems:   * No active hospital problems. *   Discharged Condition:stable  Hospital Course: observation  Consults neurology  Significant Diagnostic Studies: mri brain  Treatments: seizure control  Discharge Exam: Blood pressure 113/66, pulse 74, temperature 97.8 F (36.6 C), temperature source Oral, resp. rate 18, height 5\' 9"  (1.753 m), weight 64.9 kg (143 lb 1.3 oz), SpO2 100.00%. No weakness, no seizure  Disposition: home     Medication List    ASK your doctor about these medications       ibuprofen 200 MG tablet  Commonly known as:  ADVIL,MOTRIN  Take 400 mg by mouth every 6 (six) hours as needed for pain.     LEXAPRO PO  Take 1 tablet by mouth daily.     LORazepam 1 MG tablet  Commonly known as:  ATIVAN  Take 1 mg by mouth every 8 (eight) hours.     multivitamin with minerals Tabs  Take 1 tablet by mouth daily.     ofloxacin 0.3 % ophthalmic solution  Commonly known as:  OCUFLOX  Apply 1 drop to eye 2 (two) times daily. In operative eye     PRESCRIPTION MEDICATION  Apply 1 drop to eye daily. Propolis eye drops (antibiotic) into operative eye         Signed: Karn Cassis 09/21/2012, 6:38 PM

## 2012-09-21 NOTE — Progress Notes (Signed)
Still no word from Dr. Jeral Fruit about patient possible dc this evening.  I have explained to patient that I have not heard back from the doctor.  Lance Bosch, RN

## 2012-09-21 NOTE — Clinical Social Work Note (Signed)
Clinical Social Work   CSW received consult for possible SNF placement. CSW discussed pt with previous Unit CSW and RN during progression. Pt has progressed and will discharge home with outpatient PT/OT. CSW is signing off at this time, as no further needs are identified. Please reconsult if a need arises prior to discharge.   Dede Query, MSW, LCSW [phone]

## 2012-09-21 NOTE — Progress Notes (Signed)
Patient ID: Brent Webb, male   DOB: [birth year], 78 y.o.   MRN: [id number] i had a long talk with him, wife and son. i did review the mri with dr Corky Downs.the plan is ti have a plain ct today to check the edema and bleeding and most likely to send him home tomorrow with a f/u in my office.

## 2012-09-21 NOTE — Progress Notes (Signed)
Physical Therapy Treatment Patient Details Name: Brent Webb MRN: [id number] DOB: 1934/10/07 Today's Date: 09/21/2012 Time: 1133-1203 PT Time Calculation (min): 30 min  PT Assessment / Plan / Recommendation Comments on Treatment Session  78 yo s/p recent cataract surgery admitted with c/o L side sensory changes and "convulsive" movments.Pt with R parietal hematoma suspicious for underlying neoplasm. Presents today moving great. Performing higher level balance and dynamic gait challenges with little difficulty. Does need to slow down (very minimally) to perform lateral head turns with good precision but patient with great awareness of deficits and compensating well. 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 Don't think outpatient PT is needed at this time. Patient aware that should any of these symptoms return or balance impaired further that he can get a prescription from his MD to pursue OPPT. F/u with OPOT.     Follow Up Recommendations  Supervision for mobility/OOB;No PT follow up     Does the patient have the potential to tolerate intense rehabilitation     Barriers to Discharge        Equipment Recommendations  None recommended by PT    Recommendations for Other Services    Frequency     Plan All goals met and education completed, patient dischaged from PT services;Discharge plan needs to be updated    Precautions / Restrictions Precautions Precaution Comments: visual spatial deficits Restrictions Weight Bearing Restrictions: No   Pertinent Vitals/Pain Denies pain    Mobility  Bed Mobility Bed Mobility: Not assessed Transfers Transfers: Not assessed Ambulation/Gait Ambulation/Gait Assistance: 7: Independent Ambulation Distance (Feet): 700 Feet Assistive device: None Ambulation/Gait Assistance Details: ambulating with good speed, no LOB noted, did not observe him running into objects; abe to  manage ambulating in busy environment without difficulty Gait Pattern: Within Functional Limits Gait velocity: wnl Stairs Assistance: 5: Supervision;7: Independent Stairs Assistance Details (indicate cue type and reason): first attempt at taking the stairs he was moving very fast and needed supervision as he did stagger slightly but was able to self correct, when he slowed down this improved and he was independent with stairs Stair Management Technique: No rails;Alternating pattern;Forwards Number of Stairs: 15 (5 x3 of varying heights (2 7'' and 3 4" steps))      PT Goals Additional Goals PT Goal: Additional Goal #1 - Progress: Discontinued (comment) PT Goal: Additional Goal #2 - Progress: Discontinued (comment)  Visit Information  Last PT Received On: 09/21/12 Assistance Needed: +1    Subjective Data  Subjective: The therapist who last saw me thought I was slightly off balance wise.  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, theatre/television/film Standing Balance Static Standing - Balance Support: No upper extremity supported Static Standing - Level of Assistance: 7: Independent Static Standing - Comment/# of Minutes: no difficulty recovering from any of these tasks, did have slightly increased difficulty stepping forward with RLE in front for tandem however was able to maintain at least 20 seconds today when focused; cued him to practice this as  HEP standing next to wall to catch himself if he loses his  balance Tandem Stance - Right Leg: 20 Tandem Stance - Left Leg: 30 Rhomberg - Eyes Opened: 60 Rhomberg - Eyes Closed: 60 Standardized Balance Assessment Standardized Balance Assessment: Dynamic Gait Index Dynamic Gait Index Level Surface: Normal Change in Gait Speed: Normal Gait with Horizontal Head Turns: Mild Impairment Gait with Vertical Head Turns: Normal Gait and Pivot Turn: Normal Step Over Obstacle: Normal Step Around Obstacles: Normal Steps: Normal Total Score: 23 High Level Balance High Level Balance Activites: Backward walking;Direction changes;Sudden stops;Head turns;Turns High Level Balance Comments: no difficulties with any of these higher challenging activities; pt able to carry his lunch tray accross the room without difficulty  End of Session PT - End of Session Equipment Utilized During Treatment: Gait belt Activity Tolerance: Patient tolerated treatment well Patient left: in chair;with call bell/phone within reach;with family/visitor present Nurse Communication: Mobility status   GP     [facility] HELEN 09/21/2012, 12:49 PM

## 2012-09-22 NOTE — Care Management Note (Signed)
    Page 1 of 1   09/22/2012     10:53:58 AM   CARE MANAGEMENT NOTE 09/22/2012  Patient:  Brent Webb, Brent Webb   Account Number:  [id number]  Date Initiated:  09/21/2012  Documentation initiated by:  Jacquelynn Cree  Subjective/Objective Assessment:   admitted with intracranial bleed     Action/Plan:   PT/OT evals-recommending outpatient OT   Anticipated DC Date:  09/22/2012   Anticipated DC Plan:  HOME/SELF CARE      DC Planning Services  CM consult      Choice offered to / List presented to:             Status of service:  Completed, signed off Medicare Important Message given?   (If response is "NO", the following Medicare IM given date fields will be blank) Date Medicare IM given:   Date Additional Medicare IM given:    Discharge Disposition:  HOME/SELF CARE  Per UR Regulation:  Reviewed for med. necessity/level of care/duration of stay  If discussed at Long Length of Stay Meetings, dates discussed:    Comments:

## 2012-10-01 ENCOUNTER — Other Ambulatory Visit: Payer: Self-pay | Admitting: Neurosurgery

## 2012-10-01 ENCOUNTER — Ambulatory Visit
Admission: RE | Admit: 2012-10-01 | Discharge: 2012-10-01 | Disposition: A | Discharge status: Home / Self Care | Payer: Medicare Other | Source: Ambulatory Visit | Attending: Neurosurgery | Admitting: Neurosurgery

## 2012-10-01 DIAGNOSIS — D496 Neoplasm of unspecified behavior of brain: Secondary | ICD-10-CM

## 2012-10-01 MED ORDER — GADOBENATE DIMEGLUMINE 529 MG/ML IV SOLN
13.0000 mL | Freq: Once | INTRAVENOUS | Status: AC | PRN
  Administered 2012-10-01: 13 mL via INTRAVENOUS

## 2012-10-08 ENCOUNTER — Telehealth: Payer: Self-pay | Admitting: *Deleted

## 2012-10-08 NOTE — Telephone Encounter (Addendum)
Should this patient follow up with you or Dr. Jeral Fruit?

## 2012-10-08 NOTE — Telephone Encounter (Signed)
Message copied by Ardeth Sportsman on Thu Oct 08, 2012  2:15 PM ------      Message from: Richrd Prime      Created: Tue Oct 06, 2012  4:02 PM      Contact: Patient       Patient called to tell us he needs someone to call him back.  He has left several messages to f/u with Dr. Angelena Form nobody has returned his call.  You can reach this patient at [phone].He would like to hear back asap.  ------

## 2012-10-08 NOTE — Telephone Encounter (Signed)
Patient needs  Pos stroke office f/u appt with Lynn/me

## 2012-10-20 ENCOUNTER — Other Ambulatory Visit: Payer: Self-pay | Admitting: Neurosurgery

## 2012-10-20 DIAGNOSIS — I619 Nontraumatic intracerebral hemorrhage, unspecified: Secondary | ICD-10-CM

## 2012-10-21 ENCOUNTER — Ambulatory Visit
Admission: RE | Admit: 2012-10-21 | Discharge: 2012-10-21 | Disposition: A | Discharge status: Home / Self Care | Payer: Medicare Other | Source: Ambulatory Visit | Attending: Neurosurgery | Admitting: Neurosurgery

## 2012-10-21 DIAGNOSIS — I619 Nontraumatic intracerebral hemorrhage, unspecified: Secondary | ICD-10-CM

## 2012-10-21 MED ORDER — GADOBENATE DIMEGLUMINE 529 MG/ML IV SOLN
10.0000 mL | Freq: Once | INTRAVENOUS | Status: AC | PRN
  Administered 2012-10-21: 10 mL via INTRAVENOUS

## 2012-11-05 ENCOUNTER — Telehealth: Payer: Self-pay | Admitting: *Deleted

## 2012-11-05 NOTE — Telephone Encounter (Signed)
He wants to speak to someone about his f/u appt.  He is set to see Dr. Pearlean Brownie in July and feels this is not soon enough.  Wants to speak to someone today please.

## 2012-11-05 NOTE — Telephone Encounter (Signed)
I spoke to pt and he relayed that he is having 11-27-12 MRI after seeing Dr. Jeral Fruit recently.   Need for serial MRI for his stroke.  I moved up appt for pt to 12-16-12 with Dr. Pearlean Brownie.

## 2012-11-18 ENCOUNTER — Other Ambulatory Visit: Payer: Self-pay | Admitting: Neurosurgery

## 2012-11-18 DIAGNOSIS — I619 Nontraumatic intracerebral hemorrhage, unspecified: Secondary | ICD-10-CM

## 2012-11-26 ENCOUNTER — Ambulatory Visit
Admission: RE | Admit: 2012-11-26 | Discharge: 2012-11-26 | Disposition: A | Discharge status: Home / Self Care | Payer: Medicare Other | Source: Ambulatory Visit | Attending: Neurosurgery | Admitting: Neurosurgery

## 2012-11-26 DIAGNOSIS — I619 Nontraumatic intracerebral hemorrhage, unspecified: Secondary | ICD-10-CM

## 2012-11-26 MED ORDER — GADOBENATE DIMEGLUMINE 529 MG/ML IV SOLN
13.0000 mL | Freq: Once | INTRAVENOUS | Status: AC | PRN
  Administered 2012-11-26: 13 mL via INTRAVENOUS

## 2012-12-16 ENCOUNTER — Encounter: Payer: Self-pay | Admitting: Neurology

## 2012-12-16 ENCOUNTER — Ambulatory Visit (INDEPENDENT_AMBULATORY_CARE_PROVIDER_SITE_OTHER): Payer: Medicare Other | Admitting: Neurology

## 2012-12-16 VITALS — BP 118/64 | HR 70 | Ht 68.0 in | Wt 146.0 lb

## 2012-12-16 DIAGNOSIS — I619 Nontraumatic intracerebral hemorrhage, unspecified: Secondary | ICD-10-CM

## 2012-12-16 DIAGNOSIS — G40209 Localization-related (focal) (partial) symptomatic epilepsy and epileptic syndromes with complex partial seizures, not intractable, without status epilepticus: Secondary | ICD-10-CM

## 2012-12-16 DIAGNOSIS — G40109 Localization-related (focal) (partial) symptomatic epilepsy and epileptic syndromes with simple partial seizures, not intractable, without status epilepticus: Secondary | ICD-10-CM

## 2012-12-16 MED ORDER — LEVETIRACETAM ER 500 MG PO TB24: 500.0000 mg | ORAL_TABLET | Freq: Every day | ORAL | Status: DC

## 2012-12-16 NOTE — Patient Instructions (Signed)
Is start Keppra at 500 mg daily for seizure prophylaxis. Check EEG for silent seizures. Patient may return back to work but limit his hours and workload. Return for followup in 3 months with Larita Fife, NP

## 2012-12-16 NOTE — Progress Notes (Signed)
[facility] [address] [place]. [place] [address] [phone]       OFFICE FOLLOW-UP NOTE  [name] Date of Birth:  March 15, 1935 Medical Record Number:  [id number]   HPI: 59 year Caucasian male with right pareital cortical hemorrhages x 2 in April 2014 of unclear etiology-cortical vein thrombosis versus amyloid angiopathy and symptomatic seizures. He is seen today for f/u after hospital admission on 09/17/12 with right pareital ICH he presented with sensory seizures involving left hemibody x 2. CT and MRI showed 2 separate nearby areas of  5 x 3  x3 cm and 1.3 x 1 x 1.2 cm right pareital brain hemorrhage with mild mass effect and subarachnoid blood but no hydrocephalous or midline shift. BP was not elevated. No avm/aneurysms were found on MRA. Urine drug screen was negative.He was started on keppra for seizures.he has done well and reports no physical deficits but has noted decrease multitasking, organizational skills,and mild cognitive difficulties.He had f/u MRI done on 11/27/12 showing significant resolution of hematoma without any underlying tumor or vascular lesion.he is tolerating keppra well. ROS:   14 system review of systems is positive for decrease memory  PMH:  Past Medical History  Diagnosis Date  . Anxiety     Social History:  History   Social History  . Marital Status: Married    Spouse Name: Alona Bene    Number of Children: 4  . Years of Education: HS   Occupational History  . Not on file.   Social History Main Topics  . Smoking status: Former Smoker    Quit date: 02/08/1986  . Smokeless tobacco: Never Used  . Alcohol Use: Yes     Comment: occasionally  . Drug Use: No  . Sexually Active: Not on file   Other Topics Concern  . Not on file   Social History Narrative   Patient lives at home with his spouse.   Caffeine Use: 3 cups daily    Medications:   Current Outpatient Prescriptions on File Prior to Visit  Medication Sig Dispense  Refill  . Multiple Vitamin (MULTIVITAMIN WITH MINERALS) TABS Take 1 tablet by mouth daily.       No current facility-administered medications on file prior to visit.    Allergies:  No Known Allergies Filed Vitals:   12/16/12 1324  BP: 118/64  Pulse: 70    Physical Exam General: well developed, well nourished, seated, in no evident distress Head: head normocephalic and atraumatic. Orohparynx benign Neck: supple with no carotid or supraclavicular bruits Cardiovascular: regular rate and rhythm, no murmurs Musculoskeletal: no deformity Skin:  no rash/petichiae Vascular:  Normal pulses all extremities  Neurologic Exam Mental Status: Awake and fully alert. Oriented to place and time. Recent and remote memory diminished. Attention span, concentration and fund of knowledge appropriate. Mood and affect appropriate.  Cranial Nerves: Fundoscopic exam reveals sharp disc margins. Pupils equal, briskly reactive to light. Extraocular movements full without nystagmus. Visual fields full to confrontation. Hearing intact. Facial sensation intact. Face, tongue, palate moves normally and symmetrically.  Motor: Normal bulk and tone. Normal strength in all tested extremity muscles. Sensory.: intact to tough and pinprick and vibratory.  Coordination: Rapid alternating movements normal in all extremities. Finger-to-nose and heel-to-shin performed accurately bilaterally. Gait and Station: Arises from chair without difficulty. Stance is normal. Gait demonstrates normal stride length and balance . Able to heel, toe and tandem walk without difficulty.  Reflexes: 1+ and symmetric. Toes downgoing.     ASSESSMENT:  84 year Caucasian male with right pareital cortical hemorrhages x 2 in April 2014 of unclear etiology-cortical vein thrombosis versus amyloid angiopathy and symptomatic seizures.Repeat MRI 12/01/12 shows significant resolution making underlying tumor or structural lesion unlikely.   PLAN: Start  keppra 250 mg twice daily x 2 weeks and increase as tolerated to 500mg  twice daily.Check EEG for silent seizures.He may return to work but increase his workload and hours gradually as tolerated.f/U with Larita Fife, NP in 3 months.

## 2012-12-17 ENCOUNTER — Other Ambulatory Visit (INDEPENDENT_AMBULATORY_CARE_PROVIDER_SITE_OTHER): Payer: Medicare Other | Admitting: Radiology

## 2012-12-17 DIAGNOSIS — G40109 Localization-related (focal) (partial) symptomatic epilepsy and epileptic syndromes with simple partial seizures, not intractable, without status epilepticus: Secondary | ICD-10-CM

## 2012-12-17 DIAGNOSIS — G40209 Localization-related (focal) (partial) symptomatic epilepsy and epileptic syndromes with complex partial seizures, not intractable, without status epilepticus: Secondary | ICD-10-CM

## 2012-12-28 ENCOUNTER — Telehealth: Payer: Self-pay | Admitting: Neurology

## 2012-12-28 NOTE — Telephone Encounter (Signed)
Patient calling for test results. Called to inform messge recvd, no answer, left vmail.

## 2012-12-30 ENCOUNTER — Telehealth: Payer: Self-pay | Admitting: Neurology

## 2012-12-30 NOTE — Telephone Encounter (Signed)
Requesting EEG results

## 2013-01-01 ENCOUNTER — Ambulatory Visit: Payer: Self-pay | Admitting: Neurology

## 2013-01-01 ENCOUNTER — Ambulatory Visit: Payer: Self-pay | Admitting: Nurse Practitioner

## 2013-01-04 NOTE — Telephone Encounter (Signed)
I spoke to patient and relayed normal EEG results.  He had questions concerning memory loss, which I told him to discuss at his follow-up appointment.  Patient understood.

## 2013-03-18 ENCOUNTER — Ambulatory Visit (INDEPENDENT_AMBULATORY_CARE_PROVIDER_SITE_OTHER): Payer: Medicare Other | Admitting: Nurse Practitioner

## 2013-03-18 ENCOUNTER — Encounter: Payer: Self-pay | Admitting: Nurse Practitioner

## 2013-03-18 ENCOUNTER — Encounter (INDEPENDENT_AMBULATORY_CARE_PROVIDER_SITE_OTHER): Payer: Self-pay

## 2013-03-18 VITALS — BP 116/71 | HR 73 | Temp 97.9°F | Ht 68.5 in | Wt 148.0 lb

## 2013-03-18 DIAGNOSIS — I619 Nontraumatic intracerebral hemorrhage, unspecified: Secondary | ICD-10-CM

## 2013-03-18 DIAGNOSIS — G40209 Localization-related (focal) (partial) symptomatic epilepsy and epileptic syndromes with complex partial seizures, not intractable, without status epilepticus: Secondary | ICD-10-CM

## 2013-03-18 DIAGNOSIS — G40109 Localization-related (focal) (partial) symptomatic epilepsy and epileptic syndromes with simple partial seizures, not intractable, without status epilepticus: Secondary | ICD-10-CM

## 2013-03-18 MED ORDER — LEVETIRACETAM ER 500 MG PO TB24: 500.0000 mg | ORAL_TABLET | Freq: Every day | ORAL | Status: DC

## 2013-03-18 NOTE — Progress Notes (Addendum)
[facility]  PATIENT: Brent Webb DOB: 25-Jan-1935   REASON FOR VISIT: follow up HISTORY FROM: patient  HISTORY OF PRESENT ILLNESS: UPDATE 03/18/13 (LL): Brent Webb comes to office for ICH and seizure revisit.  He is doing very well, no recurrent seizure activity.  Only thing that bothers him is slight memory problems.  He is very active; teaches senior aerobics.  He takes no other medications other than Keppra and a multivitamin.    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  He is seen today for f/u after hospital admission on 09/17/12 with right pareital ICH he presented with sensory seizures involving left hemibody x 2. CT and MRI showed 2 separate nearby areas of 5 x 3 x3 cm and 1.3 x 1 x 1.2 cm right pareital brain hemorrhage with mild mass effect and subarachnoid blood but no hydrocephalous or midline shift. BP was not elevated. No avm/aneurysms were found on MRA. Urine drug screen was negative.He was started on keppra for seizures.he has done well and reports no physical deficits but has noted decrease multitasking, organizational skills,and mild cognitive difficulties.He had f/u MRI done on 11/27/12 showing significant resolution of hematoma without any underlying tumor or vascular lesion.he is tolerating keppra well.   ROS:  14 system review of systems is positive for decrease memory   ALLERGIES: No Known Allergies  HOME MEDICATIONS: Outpatient Prescriptions Prior to Visit  Medication Sig Dispense Refill  . Multiple Vitamin (MULTIVITAMIN WITH MINERALS) TABS Take 1 tablet by mouth daily.      Marland Kitchen levETIRAcetam (KEPPRA XR) 500 MG 24 hr tablet Take 1 tablet (500 mg total) by mouth daily.  60 tablet  3   No facility-administered medications prior to visit.    PAST MEDICAL HISTORY: Past Medical History  Diagnosis Date    . Anxiety     PAST SURGICAL HISTORY: Past Surgical History  Procedure Laterality Date  . Cataract extraction    . Shoulder surgery  2012, 2013    FAMILY HISTORY: No family history on file.  SOCIAL HISTORY: History   Social History  . Marital Status: Married    Spouse Name: Alona Bene    Number of Children: 4  . Years of Education: HS   Occupational History  . Not on file.   Social History Main Topics  . Smoking status: Former Smoker    Quit date: 02/08/1986  . Smokeless tobacco: Never Used  . Alcohol Use: Yes     Comment: occasionally  . Drug Use: No  . Sexual Activity: Yes   Other Topics Concern  . Not on file   Social History Narrative   Patient lives at home with his spouse.   Caffeine Use: 3 cups daily   PHYSICAL EXAM  Filed Vitals:   03/18/13 1317  BP: 116/71  Pulse: 73  Temp: 97.9 F (36.6 C)  TempSrc: Oral  Height: 5' 8.5" (1.74 m)  Weight: 148 lb (67.132 kg)   Body mass index is 22.17 kg/(m^2).  General: well developed, well nourished, seated, in no evident distress  Head: head normocephalic and atraumatic. Orohparynx benign  Neck: supple with no carotid or supraclavicular bruits  Cardiovascular: regular rate and rhythm, no murmurs  Musculoskeletal: no deformity  Skin: no rash/petichiae  Vascular: Normal pulses all extremities   Neurologic Exam  Mental Status: Awake and fully alert. Oriented to place and time. Recent  and remote memory diminished. Attention span, concentration and fund of knowledge appropriate. Mood and affect appropriate.  Cranial Nerves: Fundoscopic exam reveals sharp disc margins. Pupils equal, briskly reactive to light. Extraocular movements full without nystagmus. Visual fields full to confrontation. Hearing intact. Facial sensation intact. Face, tongue, palate moves normally and symmetrically.  Motor: Normal bulk and tone. Normal strength in all tested extremity muscles.  Sensory.: intact to tough and pinprick and  vibratory.  Coordination: Rapid alternating movements normal in all extremities. Finger-to-nose and heel-to-shin performed accurately bilaterally.  Gait and Station: Arises from chair without difficulty. Stance is normal. Gait demonstrates normal stride length and balance . Able to heel, toe and tandem walk without difficulty.  Reflexes: 1+ and symmetric. Toes downgoing.   DIAGNOSTIC DATA (LABS, IMAGING, TESTING) - I reviewed patient records, labs, notes, testing and imaging myself where available.  Lab Results  Component Value Date   WBC 5.2 09/17/2012   HGB 15.1 09/17/2012   HCT 41.5 09/17/2012   MCV 85.6 09/17/2012   PLT 231 09/17/2012      Component Value Date/Time   NA 136 09/17/2012 1210   K 4.1 09/17/2012 1210   CL 100 09/17/2012 1210   CO2 21 09/17/2012 1210   GLUCOSE 122* 09/17/2012 1210   BUN 21 09/17/2012 1210   CREATININE 1.20 09/17/2012 1210   CALCIUM 9.6 09/17/2012 1210   PROT 6.8 09/17/2012 1210   ALBUMIN 4.0 09/17/2012 1210   AST 22 09/17/2012 1210   ALT 17 09/17/2012 1210   ALKPHOS 53 09/17/2012 1210   BILITOT 0.8 09/17/2012 1210   GFRNONAA 57* 09/17/2012 1210   GFRAA 66* 09/17/2012 1210   MRI BRAIN W/WO 11/26/12 Continued involution and improvement in vasogenic edema surrounding  the right parietal subacute hemorrhages. No new abnormalities are  seen.  EEG 01/03/13  Normal electroencephalogram, awake, asleep and with activation procedures. There are no focal lateralizing or epileptiform features.  ASSESSMENT AND PLAN 38 year Caucasian male with right pareital cortical hemorrhages x 2 in April 2014 of unclear etiology-cortical vein thrombosis versus amyloid angiopathy and symptomatic seizures.Repeat MRI 12/01/12 shows significant resolution making underlying tumor or structural lesion unlikely. Patient doing very well, no recurrent seizures.  PLAN:  Continue Keppra XR 500 mg, once daily. Refills sent. Follow up with Larita Fife, NP in 6 months.   Meds ordered this encounter   Medications  . levETIRAcetam (KEPPRA XR) 500 MG 24 hr tablet    Sig: Take 1 tablet (500 mg total) by mouth daily.    Dispense:  90 tablet    Refill:  3    Order Specific Question:  Supervising Provider    Answer:  Patterson Hammersmith Pebble Botkin, MSN, NP-C 03/18/2013, 2:12 PM [facility] [address] [place], [place] [address] [phone]

## 2013-03-18 NOTE — Patient Instructions (Signed)
Continue Keppra XR 500 mg, once daily. Follow up with Larita Fife, NP in 6 months.

## 2013-03-18 NOTE — Addendum Note (Signed)
Addended by: Heide Guile E on: 03/18/2013 02:16 PM   Modules accepted: Orders

## 2013-08-02 ENCOUNTER — Telehealth: Payer: Self-pay | Admitting: Neurology

## 2013-08-02 DIAGNOSIS — G40209 Localization-related (focal) (partial) symptomatic epilepsy and epileptic syndromes with complex partial seizures, not intractable, without status epilepticus: Secondary | ICD-10-CM

## 2013-08-02 MED ORDER — LEVETIRACETAM ER 500 MG PO TB24: 500.0000 mg | ORAL_TABLET | Freq: Every day | ORAL | Status: DC

## 2013-08-02 NOTE — Telephone Encounter (Signed)
Pt called requesting a refill on his Keppra prescription.  Please send to [facility] [place].  Please call with any questions.  Thank you

## 2013-08-02 NOTE — Telephone Encounter (Signed)
Rx has been sent  

## 2013-09-16 ENCOUNTER — Ambulatory Visit (INDEPENDENT_AMBULATORY_CARE_PROVIDER_SITE_OTHER): Payer: Medicare Other | Admitting: Nurse Practitioner

## 2013-09-16 ENCOUNTER — Encounter (INDEPENDENT_AMBULATORY_CARE_PROVIDER_SITE_OTHER): Payer: Self-pay

## 2013-09-16 ENCOUNTER — Encounter: Payer: Self-pay | Admitting: Nurse Practitioner

## 2013-09-16 VITALS — BP 105/64 | HR 67 | Temp 97.8°F | Ht 68.5 in | Wt 147.0 lb

## 2013-09-16 DIAGNOSIS — G40209 Localization-related (focal) (partial) symptomatic epilepsy and epileptic syndromes with complex partial seizures, not intractable, without status epilepticus: Secondary | ICD-10-CM

## 2013-09-16 NOTE — Patient Instructions (Signed)
Continue Keppra XR 500 mg, once daily. Refills sent.  Follow up with Dr. Leonie Man in 6 months.

## 2013-09-16 NOTE — Progress Notes (Signed)
PATIENT: Brent Webb DOB: [birth year]  REASON FOR VISIT: routine follow up for complex partial seizures after ICH HISTORY FROM: patient  HISTORY OF PRESENT ILLNESS: 12/16/12 (PS): 70 year Caucasian male with right pareital cortical hemorrhages x 2 in April 2014 of unclear etiology-cortical vein thrombosis versus amyloid angiopathy and symptomatic seizures.  He is seen today for f/u after hospital admission on 09/17/12 with right pareital ICH he presented with sensory seizures involving left hemibody x 2. CT and MRI showed 2 separate nearby areas of 5 x 3 x3 cm and 1.3 x 1 x 1.2 cm right pareital brain hemorrhage with mild mass effect and subarachnoid blood but no hydrocephalous or midline shift. BP was not elevated. No avm/aneurysms were found on MRA. Urine drug screen was negative.He was started on keppra for seizures.he has done well and reports no physical deficits but has noted decrease multitasking, organizational skills,and mild cognitive difficulties.He had f/u MRI done on 11/27/12 showing significant resolution of hematoma without any underlying tumor or vascular lesion.he is tolerating keppra well.   UPDATE 03/18/13 (LL): Brent Webb comes to office for [place] and seizure revisit. He is doing very well, no recurrent seizure activity. Only thing that bothers him is slight memory problems. He is very active; teaches senior aerobics. He takes no other medications other than Keppra and a multivitamin.   UPDATE 09/16/13 (LL): Brent Webb comes to office for [place] and seizure revisit. He is doing well, very active, still leading aerobics classes and senior hikes.  No recurrent seizures.  Tolerating LVT ER 500 mg without problems.  REVIEW OF SYSTEMS: Full 14 system review of systems performed and notable only for: memory loss.  ALLERGIES: No Known Allergies  HOME MEDICATIONS: Outpatient Prescriptions Prior to Visit  Medication Sig Dispense Refill  . levETIRAcetam (KEPPRA XR) 500 MG 24 hr tablet Take 1  tablet (500 mg total) by mouth daily.  90 tablet  3  . Multiple Vitamin (MULTIVITAMIN WITH MINERALS) TABS Take 1 tablet by mouth daily.       No facility-administered medications prior to visit.     PHYSICAL EXAM  Filed Vitals:   09/16/13 1502  BP: 105/64  Pulse: 67  Temp: 97.8 F (36.6 C)  TempSrc: Oral  Height: 5' 8.5" (1.74 m)  Weight: 147 lb (66.679 kg)   Body mass index is 22.02 kg/(m^2).  General: well developed, well nourished, seated, in no evident distress. Head: head normocephalic and atraumatic. Orohparynx benign  Neck: supple with no carotid or supraclavicular bruits  Cardiovascular: regular rate and rhythm, no murmurs  Musculoskeletal: no deformity   Neurologic Exam  Mental Status: Awake and fully alert. Oriented to place and time. Recent and remote memory diminished. Attention span, concentration and fund of knowledge appropriate. Mood and affect appropriate.  Cranial Nerves: Pupils equal, briskly reactive to light. Extraocular movements full without nystagmus. Visual fields full to confrontation. Hearing intact. Facial sensation intact. Face, tongue, palate moves normally and symmetrically.  Motor: Normal bulk and tone. Normal strength in all tested extremity muscles.  Sensory: intact to touch and pinprick all 4 extremities Coordination: Rapid alternating movements normal in all extremities. Finger-to-nose and heel-to-shin performed accurately bilaterally.  Gait and Station: Arises from chair without difficulty. Stance is normal. Gait demonstrates normal stride length and balance . Able to heel, toe and tandem walk without difficulty.  Reflexes: 1+ and symmetric.   ASSESSMENT AND PLAN 79 year Caucasian male with right pareital cortical hemorrhages x 2 in April 2014 of unclear  etiology-cortical vein thrombosis versus amyloid angiopathy and symptomatic seizures.Repeat MRI 12/01/12 shows significant resolution making underlying tumor or structural lesion unlikely.  Patient doing very well, no recurrent seizures.   PLAN:  Continue Keppra XR 500 mg, once daily. Refills sent.  Follow up with Dr. Leonie Man in 6 months.   Philmore Pali, MSN, NP-C 09/16/2013, 3:33 PM [facility] [address], [place], [place] [address] [phone]  Note: This document was prepared with digital dictation and possible smart phrase technology. Any transcriptional errors that result from this process are unintentional.

## 2013-11-16 ENCOUNTER — Telehealth: Payer: Self-pay | Admitting: *Deleted

## 2013-11-16 NOTE — Telephone Encounter (Signed)
Patient returning [name]'s call.  °

## 2013-11-16 NOTE — Telephone Encounter (Signed)
LMVM for pt to return call.   

## 2013-11-17 NOTE — Telephone Encounter (Signed)
LMVM for pt to return call.   

## 2013-11-17 NOTE — Telephone Encounter (Signed)
Patient returning [name]'s call.  °

## 2013-11-18 NOTE — Telephone Encounter (Signed)
I LMVM for pt that returning call both home and cell.

## 2013-11-25 NOTE — Telephone Encounter (Signed)
Patient return your call, will be leaving in about 25 minutes from home.

## 2013-11-25 NOTE — Telephone Encounter (Signed)
I was able to p/u call and spoke with pt.  Made appt with Dr. Leonie Man 12-15-13 at 1000.  (having worsening memory issues).

## 2013-11-30 ENCOUNTER — Telehealth: Payer: Self-pay | Admitting: *Deleted

## 2013-11-30 ENCOUNTER — Emergency Department (HOSPITAL_COMMUNITY): Payer: Medicare Other

## 2013-11-30 ENCOUNTER — Inpatient Hospital Stay (HOSPITAL_COMMUNITY)
Admission: EM | Admit: 2013-11-30 | Discharge: 2013-12-03 | DRG: 042 | Disposition: A | Discharge status: Home / Self Care | Payer: Medicare Other | Attending: Diagnostic Neuroimaging | Admitting: Diagnostic Neuroimaging

## 2013-11-30 ENCOUNTER — Encounter (HOSPITAL_COMMUNITY): Payer: Self-pay | Admitting: Emergency Medicine

## 2013-11-30 DIAGNOSIS — I619 Nontraumatic intracerebral hemorrhage, unspecified: Principal | ICD-10-CM | POA: Diagnosis present

## 2013-11-30 DIAGNOSIS — G40109 Localization-related (focal) (partial) symptomatic epilepsy and epileptic syndromes with simple partial seizures, not intractable, without status epilepticus: Secondary | ICD-10-CM | POA: Diagnosis present

## 2013-11-30 DIAGNOSIS — I609 Nontraumatic subarachnoid hemorrhage, unspecified: Secondary | ICD-10-CM | POA: Diagnosis present

## 2013-11-30 DIAGNOSIS — G510 Bell's palsy: Secondary | ICD-10-CM | POA: Diagnosis present

## 2013-11-30 DIAGNOSIS — I639 Cerebral infarction, unspecified: Secondary | ICD-10-CM

## 2013-11-30 DIAGNOSIS — H02409 Unspecified ptosis of unspecified eyelid: Secondary | ICD-10-CM | POA: Diagnosis present

## 2013-11-30 DIAGNOSIS — E785 Hyperlipidemia, unspecified: Secondary | ICD-10-CM | POA: Diagnosis present

## 2013-11-30 DIAGNOSIS — R569 Unspecified convulsions: Secondary | ICD-10-CM | POA: Diagnosis present

## 2013-11-30 DIAGNOSIS — Z87891 Personal history of nicotine dependence: Secondary | ICD-10-CM

## 2013-11-30 DIAGNOSIS — Z8673 Personal history of transient ischemic attack (TIA), and cerebral infarction without residual deficits: Secondary | ICD-10-CM

## 2013-11-30 DIAGNOSIS — I610 Nontraumatic intracerebral hemorrhage in hemisphere, subcortical: Secondary | ICD-10-CM

## 2013-11-30 HISTORY — DX: Cerebral infarction, unspecified: I63.9

## 2013-11-30 LAB — BASIC METABOLIC PANEL
BUN: 18 mg/dL (ref 6–23)
CO2: 28 mEq/L (ref 19–32)
CREATININE: 1.18 mg/dL (ref 0.50–1.35)
Calcium: 9.2 mg/dL (ref 8.4–10.5)
Chloride: 105 mEq/L (ref 96–112)
GFR calc non Af Amer: 58 mL/min — ABNORMAL LOW (ref >90)
GFR, EST AFRICAN AMERICAN: 67 mL/min — AB (ref >90)
Glucose, Bld: 86 mg/dL (ref 70–99)
POTASSIUM: 5 meq/L (ref 3.7–5.3)
Sodium: 143 mEq/L (ref 137–147)

## 2013-11-30 LAB — CBC WITH DIFFERENTIAL/PLATELET
Basophils Absolute: 0 10*3/uL (ref 0.0–0.1)
Basophils Relative: 0 % (ref 0–1)
EOS ABS: 0.1 10*3/uL (ref 0.0–0.7)
EOS PCT: 1 % (ref 0–5)
HEMATOCRIT: 40.4 % (ref 39.0–52.0)
Hemoglobin: 13.7 g/dL (ref 13.0–17.0)
LYMPHS ABS: 0.9 10*3/uL (ref 0.7–4.0)
Lymphocytes Relative: 18 % (ref 12–46)
MCH: 30.2 pg (ref 26.0–34.0)
MCHC: 33.9 g/dL (ref 30.0–36.0)
MCV: 89.2 fL (ref 78.0–100.0)
MONO ABS: 0.4 10*3/uL (ref 0.1–1.0)
Monocytes Relative: 8 % (ref 3–12)
Neutro Abs: 3.5 10*3/uL (ref 1.7–7.7)
Neutrophils Relative %: 73 % (ref 43–77)
PLATELETS: 195 10*3/uL (ref 150–400)
RBC: 4.53 MIL/uL (ref 4.22–5.81)
RDW: 12.4 % (ref 11.5–15.5)
WBC: 4.8 10*3/uL (ref 4.0–10.5)

## 2013-11-30 LAB — PROTIME-INR
INR: 0.94 (ref 0.00–1.49)
PROTHROMBIN TIME: 12.4 s (ref 11.6–15.2)

## 2013-11-30 LAB — MRSA PCR SCREENING: MRSA by PCR: NEGATIVE

## 2013-11-30 MED ORDER — LABETALOL HCL 5 MG/ML IV SOLN: 10.0000 mg | INTRAVENOUS | Status: DC | PRN

## 2013-11-30 MED ORDER — SENNOSIDES-DOCUSATE SODIUM 8.6-50 MG PO TABS
1.0000 | ORAL_TABLET | Freq: Two times a day (BID) | ORAL | Status: DC
  Administered 2013-12-01 – 2013-12-03 (×5): 1 via ORAL
  Filled 2013-11-30 (×7): qty 1

## 2013-11-30 MED ORDER — ACETAMINOPHEN 325 MG PO TABS: 650.0000 mg | ORAL_TABLET | ORAL | Status: DC | PRN

## 2013-11-30 MED ORDER — PANTOPRAZOLE SODIUM 40 MG IV SOLR
40.0000 mg | Freq: Every day | INTRAVENOUS | Status: DC
  Administered 2013-11-30 – 2013-12-01 (×2): 40 mg via INTRAVENOUS
  Filled 2013-11-30 (×3): qty 40

## 2013-11-30 MED ORDER — ACETAMINOPHEN 650 MG RE SUPP: 650.0000 mg | RECTAL | Status: DC | PRN

## 2013-11-30 NOTE — ED Notes (Signed)
Neurology at bedside.

## 2013-11-30 NOTE — Telephone Encounter (Signed)
Pt called and is stating that he has noted R lip droop.  No other sx.  Had headache Sun/Mon and then stated that he had noted that fluid dripping out of his mouth.  I told him that he could have had stroke.  Recommended that he go to ED for eval.  They would be about to eval and do other testing if needed.   Dr.Sethi out of office. Would forward to [facility], Dr. Krista Blue.  Pt is not on blood thinner.   Has appt 12-15-13 with Dr.Sethi for worsening memory.

## 2013-11-30 NOTE — ED Provider Notes (Signed)
Patient initially seen and evaluated by Dr. Audie Pinto. MRI shows a new right posterior temporal subcortical hemorrhage with small amount of subarachnoid blood, and a punctate right parietal stroke both of which are new from prior MRI scans. Case has been discussed with Dr. Armida Sans who agrees to admit the patient to the neuro- hospitalist service.  Delora Fuel, MD 21/82/88 [id number]

## 2013-11-30 NOTE — ED Provider Notes (Signed)
CSN: [id number]     Arrival date & time 11/30/13  1157 History   First MD Initiated Contact with Patient 11/30/13 1206     Chief Complaint  Patient presents with  . Headache  . Facial Droop      HPI Pt.  was shaving and noticed the weakness on the rt. Side of face. Blinks have decreased. Posterior HA over the weekend. Dr. Kasandra Knudsen and instructed to come here. Speech clear, gait good, neg arm drift.  Past Medical History  Diagnosis Date  . Anxiety   . Stroke 09/17/2012   Past Surgical History  Procedure Laterality Date  . Cataract extraction    . Shoulder surgery  2012, 2013  . Loop recorder implant  12-03-2013    MDT LINQ implanted by Dr Caryl Comes for cryptogenic stroke   Family History  Problem Relation Age of Onset  . Hypertension Mother   . Hypertension Father    History  Substance Use Topics  . Smoking status: Former Smoker    Quit date: 02/08/1986  . Smokeless tobacco: Never Used  . Alcohol Use: Yes     Comment: occasionally    Review of Systems  All other systems reviewed and are negative.     Allergies  Review of patient's allergies indicates no known allergies.  Home Medications   Prior to Admission medications   Medication Sig Start Date End Date Taking? Authorizing Ryenn Howeth  levETIRAcetam (KEPPRA XR) 500 MG 24 hr tablet Take 1 tablet (500 mg total) by mouth daily. 08/02/13  Yes Philmore Pali, NP  Multiple Vitamin (MULTIVITAMIN WITH MINERALS) TABS Take 1 tablet by mouth daily.   Yes Historical Renu Asby, MD  terbinafine (LAMISIL) 250 MG tablet Take 250 mg by mouth daily.  08/09/13  Yes Historical Tekesha Almgren, MD  artificial tears (LACRILUBE) OINT ophthalmic ointment Place into the right eye at bedtime as needed for dry eyes. To prevent corneal scarring while eyelid is unable to close 12/03/13   Donzetta Starch, NP  pantoprazole (PROTONIX) 40 MG tablet Take 1 tablet (40 mg total) by mouth at bedtime. 12/03/13   Donzetta Starch, NP  polyvinyl alcohol (LIQUIFILM TEARS) 1.4 %  ophthalmic solution Place 1 drop into the right eye as needed for dry eyes. 12/03/13   Donzetta Starch, NP  predniSONE (DELTASONE) 20 MG tablet Take 3 tablets (60 mg total) by mouth daily with breakfast. 12/04/13   Donzetta Starch, NP  simvastatin (ZOCOR) 10 MG tablet Take 1 tablet (10 mg total) by mouth daily at 6 PM. 12/03/13   Donzetta Starch, NP  valACYclovir (VALTREX) 1000 MG tablet Take 1 tablet (1,000 mg total) by mouth 3 (three) times daily. 12/03/13   Donzetta Starch, NP   BP 106/47  Pulse 69  Temp(Src) 97.6 F (36.4 C) (Oral)  Resp 23  Ht 5\' 10"  (1.778 m)  Wt 143 lb (64.864 kg)  BMI 20.52 kg/m2  SpO2 100% Physical Exam  Nursing note and vitals reviewed. Constitutional: He is oriented to person, place, and time. He appears well-developed and well-nourished. No distress.  HENT:  Head: Normocephalic and atraumatic.  Eyes: Pupils are equal, round, and reactive to light.  Neck: Normal range of motion.  Cardiovascular: Normal rate and intact distal pulses.   Pulmonary/Chest: No respiratory distress.  Abdominal: Normal appearance. He exhibits no distension.  Musculoskeletal: Normal range of motion.  Neurological: He is alert and oriented to person, place, and time. He has normal strength. A cranial nerve deficit (R  sided facil weakness) is present. He displays a negative Romberg sign. Coordination and gait normal. GCS eye subscore is 4. GCS verbal subscore is 5. GCS motor subscore is 6.  Skin: Skin is warm and dry. No rash noted.  Psychiatric: He has a normal mood and affect. His behavior is normal.    ED Course  Procedures (including critical care time) Labs Review Labs Reviewed  BASIC METABOLIC PANEL - Abnormal; Notable for the following:    GFR calc non Af Amer 58 (*)    GFR calc Af Amer 67 (*)    All other components within normal limits  C-REACTIVE PROTEIN - Abnormal; Notable for the following:    CRP 0.5 (*)    All other components within normal limits  CARDIOLIPIN ANTIBODY -  Abnormal; Notable for the following:    Phospholipids 280 (*)    All other components within normal limits  LIPID PANEL - Abnormal; Notable for the following:    Cholesterol 202 (*)    LDL Cholesterol 112 (*)    All other components within normal limits  MRSA PCR SCREENING  CBC WITH DIFFERENTIAL  PROTIME-INR  SEDIMENTATION RATE  S CEREVISIAE ABS  ANA  B. BURGDORFI ANTIBODIES BY WB  LUPUS ANTICOAGULANT PANEL  HEMOGLOBIN A1C  FACTOR 5 LEIDEN   CRITICAL CARE Performed by: Leonard Schwartz L Total critical care time: 30 min Critical care time was exclusive of separately billable procedures and treating other patients. Critical care was necessary to treat or prevent imminent or life-threatening deterioration. Critical care was time spent personally by me on the following activities: development of treatment plan with patient and/or surrogate as well as nursing, discussions with consultants, evaluation of patient's response to treatment, examination of patient, obtaining history from patient or surrogate, ordering and performing treatments and interventions, ordering and review of laboratory studies, ordering and review of radiographic studies, pulse oximetry and re-evaluation of patient's condition.  Imaging Review Ct Head Wo Contrast  11/30/2013   CLINICAL DATA:  Right lip droop, headache. Past personal history of stroke.  EXAM: CT HEAD WITHOUT CONTRAST  TECHNIQUE: Contiguous axial images were obtained from the base of the skull through the vertex without intravenous contrast.  COMPARISON:  Prior CT head 09/21/2012 its  FINDINGS: Small volume of high attenuation material layering within sulci of the right high parietal lobe just anterior to the region of encephalomalacia from the remote prior intraparenchymal hemorrhage. No definite acute infarct, Um mass, mass effect, hydrocephalus or midline shift.  Normal soft tissues and calvarium. Normal aeration of the mastoid air cells and visualized  paranasal sinuses.  IMPRESSION:  1. Positive for small volume acute subarachnoid hemorrhage in the high right parietal lobe.  2. Expected evolution of remote right parietal intraparenchymal hemorrhage.  Critical Value/emergent results were called by telephone at the time of interpretation on 11/30/2013 at 1:28 PM to Dr. Leonard Schwartz , who verbally acknowledged these results.    Electronically Signed   By: Jacqulynn Cadet M.D.   On: 11/30/2013 13:48   Dr. Dory Larsen from neurosurgery review the films and did not feel this was a neurosurgical issue.  He has a call neurology.  Dr. Marlowe Kays was contacted for neurology.  He felt the patient had a Bell's palsy and thought that the CT scan was an evolution of his previous hemorrhagic event.  Plan at this time skin MRI scan.  Dr. Aram Beecham will review the scan if negative plans the same home with prednisone and treating him as a Bell's palsy.  EKG Interpretation   Date/Time:  Tuesday November 30 2013 12:02:20 EDT Ventricular Rate:  75 PR Interval:  156 QRS Duration: 92 QT Interval:  366 QTC Calculation: 408 R Axis:   84 Text Interpretation:  Normal sinus rhythm Normal ECG Confirmed by BEATON   MD, ROBERT ([id number]) on 11/30/2013 2:05:14 PM      MDM   Final diagnoses:  Nontraumatic subcortical hemorrhage of cerebral hemisphere  Stroke  Right-sided Bell's palsy        Dot Lanes, MD 12/05/13 1944

## 2013-11-30 NOTE — ED Notes (Signed)
MD at bedside. 

## 2013-11-30 NOTE — Consult Note (Addendum)
NEURO HOSPITALIST CONSULT NOTE    Reason for Consult: right facial droop.   HPI:                                                                                                                                          Brent Webb is an 79 y.o. 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 patient is followed by Dr. Leonie Man as a out patient and has been doing well since his previous right parietal ICH. 4 days ago he noted a HA located int he right occipital region extending up the back of his head. This AM he awoke and noted his right corner of his mouth was slightly drooping.  He also noted when he placed water in his mouth he tended to drool out of the right corner of his mouth. Other wise he feels his normal baseline.    Past Medical History  Diagnosis Date  . Anxiety   . Stroke 09/17/2012    Past Surgical History  Procedure Laterality Date  . Cataract extraction    . Shoulder surgery  2012, 2013    Family History  Problem Relation Age of Onset  . Hypertension Mother   . Hypertension Father      Social History:  reports that he quit smoking about 27 years ago. He has never used smokeless tobacco. He reports that he drinks alcohol. He reports that he does not use illicit drugs.  No Known Allergies  MEDICATIONS:                                                                                                                     No current facility-administered medications for this encounter.   Current Outpatient Prescriptions  Medication Sig Dispense Refill  . levETIRAcetam (KEPPRA XR) 500 MG 24 hr tablet Take 1 tablet (500 mg total) by mouth daily.  90 tablet  3  . Multiple Vitamin (MULTIVITAMIN WITH MINERALS) TABS Take 1 tablet by mouth daily.      Marland Kitchen terbinafine (LAMISIL) 250 MG tablet Take 250 mg by mouth daily.           ROS:  History obtained from the patient  General ROS: negative for - chills, fatigue, fever, night sweats, weight gain or weight loss Psychological ROS: negative for - behavioral disorder, hallucinations, memory difficulties, mood swings or suicidal ideation Ophthalmic ROS: negative for - blurry vision, double vision, eye pain or loss of vision ENT ROS: negative for - epistaxis, nasal discharge, oral lesions, sore throat, tinnitus or vertigo Allergy and Immunology ROS: negative for - hives or itchy/watery eyes Hematological and Lymphatic ROS: negative for - bleeding problems, bruising or swollen lymph nodes Endocrine ROS: negative for - galactorrhea, hair pattern changes, polydipsia/polyuria or temperature intolerance Respiratory ROS: negative for - cough, hemoptysis, shortness of breath or wheezing Cardiovascular ROS: negative for - chest pain, dyspnea on exertion, edema or irregular heartbeat Gastrointestinal ROS: negative for - abdominal pain, diarrhea, hematemesis, nausea/vomiting or stool incontinence Genito-Urinary ROS: negative for - dysuria, hematuria, incontinence or urinary frequency/urgency Musculoskeletal ROS: negative for - joint swelling or muscular weakness Neurological ROS: as noted in HPI Dermatological ROS: negative for rash and skin lesion changes   Blood pressure 108/67, pulse 60, temperature 97.7 F (36.5 C), temperature source Oral, resp. rate 25, height 5\' 10"  (1.778 m), weight 64.864 kg (143 lb), SpO2 98.00%.   Neurologic Examination:                                                                                                      Mental Status: Alert, oriented, thought content appropriate.  Speech fluent without evidence of aphasia.  Able to follow 3 step commands without difficulty. Cranial Nerves: II: Discs flat bilaterally; Visual fields grossly  normal, pupils equal, round, reactive to light and accommodation III,IV, VI: ptosis not present, extra-ocular motions intact bilaterally, weak closure of right eyelids and delays blink on the right V,VII: smile asymmetric on the right lower face, facial light touch sensation normal bilaterally VIII: hearing normal bilaterally IX,X: gag reflex present XI: bilateral shoulder shrug XII: midline tongue extension without atrophy or fasciculations  Motor: Right : Upper extremity   5/5    Left:     Upper extremity   5/5  Lower extremity   5/5     Lower extremity   5/5 Tone and bulk:normal tone throughout; no atrophy noted Sensory: Pinprick and light touch intact throughout, bilaterally Deep Tendon Reflexes:  Right: Upper Extremity   Left: Upper extremity   biceps (C-5 to C-6) 2/4   biceps (C-5 to C-6) 2/4 tricep (C7) 2/4    triceps (C7) 2/4 Brachioradialis (C6) 2/4  Brachioradialis (C6) 2/4  Lower Extremity Lower Extremity  quadriceps (L-2 to L-4) 2/4   quadriceps (L-2 to L-4) 2/4 Achilles (S1) 2/4   Achilles (S1) 2/4  Plantars: Right: downgoing   Left: downgoing Cerebellar: normal finger-to-nose,  normal heel-to-shin test Gait: normal CV: pulses palpable throughout    Lab Results: Basic Metabolic Panel:  Recent Labs Lab 11/30/13 1340  NA 143  K 5.0  CL 105  CO2 28  GLUCOSE 86  BUN 18  CREATININE 1.18  CALCIUM 9.2    Liver Function Tests: No results found for  this basename: AST, ALT, ALKPHOS, BILITOT, PROT, ALBUMIN,  in the last 168 hours No results found for this basename: LIPASE, AMYLASE,  in the last 168 hours No results found for this basename: AMMONIA,  in the last 168 hours  CBC:  Recent Labs Lab 11/30/13 1340  WBC 4.8  NEUTROABS 3.5  HGB 13.7  HCT 40.4  MCV 89.2  PLT 195    Cardiac Enzymes: No results found for this basename: CKTOTAL, CKMB, CKMBINDEX, TROPONINI,  in the last 168 hours  Lipid Panel: No results found for this basename: CHOL, TRIG,  HDL, CHOLHDL, VLDL, LDLCALC,  in the last 168 hours  CBG: No results found for this basename: GLUCAP,  in the last 168 hours  Microbiology: Results for orders placed during the hospital encounter of 09/17/12  SURGICAL PCR SCREEN     Status: None   Collection Time    09/17/12  5:10 PM      Result Value Ref Range Status   MRSA, PCR NEGATIVE  NEGATIVE Final   Staphylococcus aureus NEGATIVE  NEGATIVE Final   Comment:            The Xpert SA Assay (FDA     approved for NASAL specimens     in patients over 21 years of age),     is one component of     a comprehensive surveillance     program.  Test performance has     been validated by [employer] for patients greater     than or equal to 77 year old.     It is not intended     to diagnose infection nor to     guide or monitor treatment.    Coagulation Studies:  Recent Labs  11/30/13 1340  LABPROT 12.4  INR 0.94    Imaging: Ct Head Wo Contrast  11/30/2013   CLINICAL DATA:  Right lip droop, headache. Past personal history of stroke.  EXAM: CT HEAD WITHOUT CONTRAST  TECHNIQUE: Contiguous axial images were obtained from the base of the skull through the vertex without intravenous contrast.  COMPARISON:  Prior CT head 09/21/2012 its  FINDINGS: Small volume of high attenuation material layering within sulci of the right high parietal lobe just anterior to the region of encephalomalacia from the remote prior intraparenchymal hemorrhage. No definite acute infarct, Um mass, mass effect, hydrocephalus or midline shift.  Normal soft tissues and calvarium. Normal aeration of the mastoid air cells and visualized paranasal sinuses.  IMPRESSION: 1. Positive for small volume acute subarachnoid hemorrhage in the high right parietal lobe. 2. Expected evolution of remote right parietal intraparenchymal hemorrhage.  Critical Value/emergent results were called by telephone at the time of interpretation on 11/30/2013 at 1:28 PM to Dr. Leonard Schwartz ,  who verbally acknowledged these results.   Electronically Signed   By: Jacqulynn Cadet M.D.   On: 11/30/2013 13:48       Assessment and plan per attending neurologist  Etta Quill PA-C [facility] [phone]  11/30/2013, 2:57 PM   Assessment/Plan: 79 YO male with new onset asymmetry right lower face and weak closure of right eyelid. Most likely peripheral facial nerve palsy. however given recent right ICH patient would benefit from MRI brain to rule out any intracranial abnormality on the left aspect that could be etiology of right facial droop.   Recommend: 1) MRI brain without contrast 2) Will follow up after MRI      Patient seen and  examined together with physician assistant and I concur with the assessment and plan.  Dorian Pod, MD  Addendum; MRI reveals 1. 2.2 cm subacute posterior right temporal subcortical parenchymal  hematoma, new from the prior MRI. Previously described right  parietal hematomas demonstrate expected interval evolution with  associated encephalomalacia. Findings could reflect sequelae of  amyloid angiography.  2. Trace acute right parietal subarachnoid hemorrhage as seen on  today's CT.  3. Punctate acute cortical infarct in the right parietal operculum.  Admit to NICU. Neurosurgery consulted by ED. Stroke team to follow up in am.  Dorian Pod ,MD

## 2013-11-30 NOTE — ED Notes (Signed)
Meal tray ordered 

## 2013-11-30 NOTE — ED Notes (Signed)
Pt. Stated, i was shaving and noticed the weakness on the rt. Side of face.  Blinks have decreased. Posterior HA over the weekend. Dr. Kasandra Knudsen and instructed to come here. Speech clear, gait good, neg arm drift.

## 2013-12-01 DIAGNOSIS — G510 Bell's palsy: Secondary | ICD-10-CM

## 2013-12-01 DIAGNOSIS — I369 Nonrheumatic tricuspid valve disorder, unspecified: Secondary | ICD-10-CM

## 2013-12-01 LAB — SEDIMENTATION RATE: SED RATE: 5 mm/h (ref 0–16)

## 2013-12-01 LAB — C-REACTIVE PROTEIN: CRP: 0.5 mg/dL — ABNORMAL LOW (ref <0.60)

## 2013-12-01 MED ORDER — LEVETIRACETAM ER 500 MG PO TB24
500.0000 mg | ORAL_TABLET | Freq: Every day | ORAL | Status: DC
  Administered 2013-12-01 – 2013-12-03 (×3): 500 mg via ORAL
  Filled 2013-12-01 (×3): qty 1

## 2013-12-01 NOTE — Progress Notes (Signed)
PT Cancellation Note  Patient Details Name: Brent Webb MRN: [id number] DOB: January 27, 1935   Cancelled Treatment:    Reason Eval/Treat Not Completed: Patient not medically ready (Pt with active bedrest orders at this time) Will follow up once activity orders updated.   Duncan Dull 12/01/2013, 7:43 AM  Alben Deeds, PT DPT  [phone]

## 2013-12-01 NOTE — Evaluation (Signed)
Speech Language Pathology Evaluation Patient Details Name: Marcelus Dubberly MRN: [id number] DOB: 02/04/1935 Today's Date: 12/01/2013 Time: 1607-3710 SLP Time Calculation (min): 33 min  Problem List:  Patient Active Problem List   Diagnosis Date Noted  . Stroke due to intracerebral hemorrhage 11/30/2013  . Intracerebral hemorrhage 12/16/2012  . 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 Anxiety   . Stroke 09/17/2012   Past Surgical History:  Past Surgical History  Procedure Laterality Date  . Cataract extraction    . Shoulder surgery  2012, 2013   HPI:  Brent Webb is an 79 y.o. male with hx of right parietal cortical hemorrhages x 2 in April 2014 of unclear etiology-cortical vein thrombosis versus amyloid angiopathy and symptomatic seizures. Admitted to ICU 6/23 with dx acute right posterior temporal intracranial hemorrhage and a right Bell's palsy per neuro.  MRI: 2.2 cm subacute posterior right temporal subcortical parenchymal hematoma, new from the prior MRI. Previously described right parietal hematomas demonstrate expected interval evolution with associated encephalomalacia. 2. Trace acute right parietal subarachnoid hemorrhage; 3. Punctate acute cortical infarct in the right parietal operculum.    Assessment / Plan / Recommendation Clinical Impression  Pt presents with baseline deficits in higher-level attention (divided), prospective and working memory s/p right parietal hemorrhage in 2014.  There are no acute changes in his cognition or language.  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  He has excellent insight into his deficits in memory and attention, and has developed complex compensatory strategies to cope with them in order to maintain his lifestyle.  No SLP needs identified - will sign off.     SLP Assessment  Patient does not need any further Speech Lanaguage Pathology  Services    Follow Up Recommendations  None     SLP Evaluation Prior Functioning  Cognitive/Linguistic Baseline: Baseline deficits Baseline deficit details: deficits in short-term and working memory, prospective memory, and divided attention  Type of Home: House  Lives With: Spouse Available Help at Discharge: Family Vocation: Retired   Associate Professor  Overall Cognitive Status: History of cognitive impairments - at baseline Arousal/Alertness: Awake/alert Orientation Level: Oriented X4 Attention: Divided Divided Attention: Impaired Divided Attention Impairment: Verbal complex;Functional complex Memory: Impaired Memory Impairment: Storage deficit;Prospective memory;Decreased short term memory Decreased Short Term Memory: Verbal complex;Functional complex Awareness: Appears intact Problem Solving: Appears intact Safety/Judgment: Appears intact    Comprehension  Auditory Comprehension Overall Auditory Comprehension: Appears within functional limits for tasks assessed Visual Recognition/Discrimination Discrimination: Within Function Limits Reading Comprehension Reading Status: Within funtional limits    Expression Expression Primary Mode of Expression: Verbal Verbal Expression Overall Verbal Expression: Appears within functional limits for tasks assessed Written Expression Dominant Hand: Right Written Expression: Within Functional Limits   Oral / Motor Oral Motor/Sensory Function Overall Oral Motor/Sensory Function:  (right bell's palsy per neuro) Motor Speech Overall Motor Speech: Appears within functional limits for tasks assessed   Amanda L. Tivis Ringer, [place] CCC/SLP Pager [phone]      Juan Quam Laurice 12/01/2013, 4:49 PM

## 2013-12-01 NOTE — Evaluation (Signed)
Physical Therapy Evaluation Patient Details Name: Brent Webb MRN: [id number] DOB: [birth year] Today's Date: 12/01/2013   History of Present Illness  Brent Webb is an 79 y.o. male right pareital cortical hemorrhages x 2 in April 2014. Pt presented to [facility] with CVA like symptoms, work up revealed patient s/p Punctate acute cortical infarct in the right parietal operculum.   Clinical Impression  Patient demonstrates independence with all mobility, minimal baseline deviations from prior CVA. No acute PT needs at this time, will sign off, patient in agreement.    Follow Up Recommendations No PT follow up    Equipment Recommendations  None recommended by PT    Recommendations for Other Services       Precautions / Restrictions        Mobility  Bed Mobility Overal bed mobility: Independent                Transfers Overall transfer level: Independent                  Ambulation/Gait Ambulation/Gait assistance: Independent              Stairs Stairs: Yes Stairs assistance: Independent Stair Management: No rails;Alternating pattern Number of Stairs: 6 General stair comments: performed x2 no difficulty  Wheelchair Mobility    Modified Rankin (Stroke Patients Only) Modified Rankin (Stroke Patients Only) Pre-Morbid Rankin Score: No significant disability Modified Rankin: No significant disability     Balance Overall balance assessment: Independent               Single Leg Stance - Right Leg: 30 Single Leg Stance - Left Leg: 10         High level balance activites: Side stepping;Backward walking;Direction changes;Turns;Head turns;Sudden stops   Standardized Balance Assessment Standardized Balance Assessment : Dynamic Gait Index   Dynamic Gait Index Level Surface: Normal Change in Gait Speed: Normal Gait with Horizontal Head Turns: Moderate Impairment Gait with Vertical Head Turns: Normal Gait and Pivot Turn: Normal Step Over Obstacle:  Normal Step Around Obstacles: Normal Steps: Normal Total Score: 22       Pertinent Vitals/Pain No pain, VSS    Home Living Family/patient expects to be discharged to:: Private residence Living Arrangements: Spouse/significant other Available Help at Discharge: Family Type of Home: House Home Access: Stairs to enter Entrance Stairs-Rails: Right Entrance Stairs-Number of Steps: 4 Home Layout: Two level Home Equipment: None      Prior Function Level of Independence: Independent         Comments: teaches senior aerobics     Hand Dominance   Dominant Hand: Right    Extremity/Trunk Assessment   Upper Extremity Assessment: Overall WFL for tasks assessed           Lower Extremity Assessment: Overall WFL for tasks assessed         Communication   Communication: No difficulties  Cognition Arousal/Alertness: Awake/alert Behavior During Therapy: WFL for tasks assessed/performed Overall Cognitive Status: Within Functional Limits for tasks assessed                      General Comments General comments (skin integrity, edema, etc.): provided some basic ther-ex LE and hip strengthening exercises for higher level balance program. Patient with some modest asymetrical weakness at baseline in hip adduction impairing veryhigh level balance minimally.    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 PT Goal Formulation: No goals set, d/c therapy    Frequency     Barriers to discharge        Co-evaluation               End of Session Equipment Utilized During Treatment: Gait belt Activity Tolerance: Patient tolerated treatment well Patient left: in chair;with family/visitor present;with  call bell/phone within reach Nurse Communication: Mobility status         Time: 1125-1150 PT Time Calculation (min): 25 min   Charges:   PT Evaluation $Initial PT Evaluation Tier I: 1 Procedure PT Treatments $Gait Training: 8-22 mins $Self Care/Home Management: 8-22   PT G CodesDuncan Dull 12/01/2013, 3:50 PM Alben Deeds, [place] DPT  [phone]

## 2013-12-01 NOTE — Progress Notes (Signed)
  Echocardiogram 2D Echocardiogram has been performed.  Brent Webb 12/01/2013, 2:25 PM

## 2013-12-01 NOTE — Progress Notes (Signed)
Stroke Team Progress Note  HISTORY Angle Karel is an 79 y.o. 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 patient is followed by Dr. Leonie Man as a out patient and has been doing well since his previous right parietal ICH. 4 days ago he noted a HA located int he right occipital region extending up the back of his head. This AM he awoke and noted his right corner of his mouth was slightly drooping. He also noted when he placed water in his mouth he tended to drool out of the right corner of his mouth. Other wise he feels his normal baseline.    Patient was not a TPA candidate secondary to history of ICH. He was admitted to the neuro ICU 3100 for further evaluation and treatment.  SUBJECTIVE No family is at bedside. The patient is alert, cooperative, indicates that he feels well, no headache. No nausea or vomiting. He indicates that he had a mild right occipital headache approximately 2 or 3 weeks prior to admission.  OBJECTIVE Most recent Vital Signs: Filed Vitals:   12/01/13 0350 12/01/13 0400 12/01/13 0500 12/01/13 0738  BP:  122/57 107/58   Pulse:  55 81   Temp: 97.5 F (36.4 C)   98 F (36.7 C)  TempSrc: Oral   Oral  Resp:  15 16   Height:      Weight:      SpO2:  95% 98%    CBG (last 3)  No results found for this basename: GLUCAP,  in the last 72 hours  IV Fluid Intake:     MEDICATIONS  . pantoprazole (PROTONIX) IV  40 mg Intravenous QHS  . senna-docusate  1 tablet Oral BID   PRN:  acetaminophen, acetaminophen, labetalol  Diet:  General thin liquids Activity:   Up with assistance DVT Prophylaxis:  SCD  CLINICALLY SIGNIFICANT STUDIES Basic Metabolic Panel:  Recent Labs Lab 11/30/13 1340  NA 143  K 5.0  CL 105  CO2 28  GLUCOSE 86  BUN 18  CREATININE 1.18  CALCIUM 9.2   Liver Function Tests: No results found for this basename: AST, ALT, ALKPHOS, BILITOT,  PROT, ALBUMIN,  in the last 168 hours CBC:  Recent Labs Lab 11/30/13 1340  WBC 4.8  NEUTROABS 3.5  HGB 13.7  HCT 40.4  MCV 89.2  PLT 195   Coagulation:  Recent Labs Lab 11/30/13 1340  LABPROT 12.4  INR 0.94   Cardiac Enzymes: No results found for this basename: CKTOTAL, CKMB, CKMBINDEX, TROPONINI,  in the last 168 hours Urinalysis: No results found for this basename: COLORURINE, APPERANCEUR, LABSPEC, PHURINE, GLUCOSEU, HGBUR, BILIRUBINUR, KETONESUR, PROTEINUR, UROBILINOGEN, NITRITE, LEUKOCYTESUR,  in the last 168 hours Lipid Panel No results found for this basename: chol, trig, hdl, cholhdl, vldl, ldlcalc   HgbA1C  No results found for this basename: HGBA1C    Urine Drug Screen:   No results found for this basename: labopia, cocainscrnur, labbenz, amphetmu, thcu, labbarb    Alcohol Level: No results found for this basename: ETH,  in the last 168 hours  Ct Head Wo Contrast  11/30/2013   CLINICAL DATA:  Right lip droop, headache. Past personal history of stroke.  EXAM: CT HEAD WITHOUT CONTRAST  TECHNIQUE: Contiguous axial images were obtained from the base of the skull through the vertex without intravenous contrast.  COMPARISON:  Prior CT head 09/21/2012 its  FINDINGS: Small volume of high attenuation material layering within  sulci of the right high parietal lobe just anterior to the region of encephalomalacia from the remote prior intraparenchymal hemorrhage. No definite acute infarct, Um mass, mass effect, hydrocephalus or midline shift.  Normal soft tissues and calvarium. Normal aeration of the mastoid air cells and visualized paranasal sinuses.  IMPRESSION: 1. Positive for small volume acute subarachnoid hemorrhage in the high right parietal lobe. 2. Expected evolution of remote right parietal intraparenchymal hemorrhage.  Critical Value/emergent results were called by telephone at the time of interpretation on 11/30/2013 at 1:28 PM to Dr. Leonard Schwartz , who verbally acknowledged  these results.   Electronically Signed   By: Jacqulynn Cadet M.D.   On: 11/30/2013 13:48   Mr Brain Wo Contrast  11/30/2013   CLINICAL DATA:  Right occipital headache and right facial droop. History of stroke.  EXAM: MRI HEAD WITHOUT CONTRAST  TECHNIQUE: Multiplanar, multiecho pulse sequences of the brain and surrounding structures were obtained without intravenous contrast.  COMPARISON:  Head CT 11/30/2013 and MRI 11/26/2012  FINDINGS: There is a punctate focus of cortical restricted diffusion in the right parietal operculum (series 4, image 20) consistent with acute infarct. Minimal FLAIR hyperintensity in the right postcentral sulcus corresponds to the trace subarachnoid hemorrhage described on CT earlier today. Susceptibility artifact within multiple right cerebral hemispheric sulci is again seen consistent with prior subarachnoid hemorrhage.  Compared to the prior MRI, has been further evolution of the previously described right parietal parenchymal hemorrhage with associated encephalomalacia. Since the prior MRI, there is a new T1 and T2 hyperintense subcortical parenchymal hematoma in the posterior right temporal lobe measuring 2.2 x 1.5 cm. There is a small amount of surrounding edema. Scattered foci of T2 hyperintensity within the subcortical and deep cerebral white matter bilaterally are nonspecific but compatible with mild chronic small vessel ischemic disease. There is mild cerebral atrophy. There is no midline shift or extra-axial fluid collection.  Prior bilateral cataract extraction is noted. Paranasal sinuses and mastoid air cells are clear. Major intracranial vascular flow voids are preserved. Joint effusion at the right atlantooccipital articulation is unchanged.  IMPRESSION: 1. 2.2 cm subacute posterior right temporal subcortical parenchymal hematoma, new from the prior MRI. Previously described right parietal hematomas demonstrate expected interval evolution with associated encephalomalacia.  Findings could reflect sequelae of amyloid angiography. 2. Trace acute right parietal subarachnoid hemorrhage as seen on today's CT. 3. Punctate acute cortical infarct in the right parietal operculum. Critical Value/emergent results were called by telephone at the time of interpretation on 11/30/2013 at 6:19 PM to Dr. Lind Guest, who verbally acknowledged these results.   Electronically Signed   By: Logan Bores   On: 11/30/2013 18:21    CT of the brain   IMPRESSION:  1. Positive for small volume acute subarachnoid hemorrhage in the  high right parietal lobe.  2. Expected evolution of remote right parietal intraparenchymal  hemorrhage.  MRI of the brain   IMPRESSION:  1. 2.2 cm subacute posterior right temporal subcortical parenchymal  hematoma, new from the prior MRI. Previously described right  parietal hematomas demonstrate expected interval evolution with  associated encephalomalacia. Findings could reflect sequelae of  amyloid angiography.  2. Trace acute right parietal subarachnoid hemorrhage as seen on  today's CT.  3. Punctate acute cortical infarct in the right parietal operculum.  MRA of the brain    2D Echocardiogram    Carotid Doppler    CXR    EKG    Normal sinus rhythm Normal ECG  Therapy Recommendations  Pending  Physical Exam  General: The patient is alert and cooperative at the time of the examination.  Respiratory: Lung fields clear  Cardiovascular: Regular rate and rhythm, no murmurs or rubs  Abdomen: Soft, nontender, positive bowel sounds  Skin: No significant peripheral edema is noted.   Neurologic Exam  Mental status: The patient is oriented x 3.  Cranial nerves: Facial symmetry is not present. The patient has peripheral facial weakness on the right, decreased blink on the right. Speech is normal, no aphasia or dysarthria is noted. Extraocular movements are full. Visual fields are full.  Motor: The patient has good strength in all 4  extremities.  Sensory examination: Soft touch sensation is symmetric on the face, arms, and legs.  Coordination: The patient has good finger-nose-finger and heel-to-shin bilaterally.  Gait and station: The gait was not tested. No drift is seen with the arms.  Reflexes: Deep tendon reflexes are symmetric.    ASSESSMENT Mr. Johncharles Fusselman is a 79 y.o. male presenting with a right Bell's palsy. The patient noted a mild right occipital headache 2 or 3 weeks prior to admission. The patient presents with a right temporal intracranial hemorrhage. Old right parietal hemorrhage in 2014. The Bell's palsy is not related to intracranial hemorrhage. These 2 events appeared to be completely unrelated, but we'll need to evaluate the patient for a hypercoagulable state, or for a vasculitis process. He is admitted for evaluation and observation. TPA was not given secondary to it, hemorrhage, and onset of Bell's palsy.   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  Blood work for vasculitis workup, hypercoagulable state  Physical and occupational therapy evaluation  If the patient remains stable, discharge tomorrow to home.  Carotid Doppler study  2-D echocardiogram  WILLIS,CHARLES KEITH  12/01/2013 7:59 AM

## 2013-12-01 NOTE — Progress Notes (Signed)
Stroke Team Progress Note

## 2013-12-01 NOTE — Progress Notes (Signed)
UR completed.  Michelle Bryson, RN BSN MHA CCM Trauma/Neuro ICU Case Manager [phone]  

## 2013-12-02 ENCOUNTER — Inpatient Hospital Stay (HOSPITAL_COMMUNITY): Payer: Medicare Other

## 2013-12-02 ENCOUNTER — Encounter (HOSPITAL_COMMUNITY): Payer: Self-pay | Admitting: Radiology

## 2013-12-02 DIAGNOSIS — I619 Nontraumatic intracerebral hemorrhage, unspecified: Secondary | ICD-10-CM

## 2013-12-02 DIAGNOSIS — I639 Cerebral infarction, unspecified: Secondary | ICD-10-CM | POA: Diagnosis present

## 2013-12-02 DIAGNOSIS — I609 Nontraumatic subarachnoid hemorrhage, unspecified: Secondary | ICD-10-CM | POA: Diagnosis present

## 2013-12-02 LAB — LIPID PANEL
Cholesterol: 202 mg/dL — ABNORMAL HIGH (ref 0–200)
HDL: 73 mg/dL (ref >39)
LDL Cholesterol: 112 mg/dL — ABNORMAL HIGH (ref 0–99)
TRIGLYCERIDES: 87 mg/dL (ref <150)
Total CHOL/HDL Ratio: 2.8 RATIO
VLDL: 17 mg/dL (ref 0–40)

## 2013-12-02 LAB — B. BURGDORFI ANTIBODIES BY WB
B BURGDORFERI IGG ABS (IB): NEGATIVE
B burgdorferi IgM Abs (IB): NEGATIVE

## 2013-12-02 LAB — ANA: ANA: NEGATIVE

## 2013-12-02 LAB — HEMOGLOBIN A1C
Hgb A1c MFr Bld: 5.4 % (ref <5.7)
MEAN PLASMA GLUCOSE: 108 mg/dL (ref <117)

## 2013-12-02 MED ORDER — PANTOPRAZOLE SODIUM 40 MG PO TBEC
40.0000 mg | DELAYED_RELEASE_TABLET | Freq: Every day | ORAL | Status: DC
  Filled 2013-12-02: qty 1

## 2013-12-02 MED ORDER — SIMVASTATIN 10 MG PO TABS
10.0000 mg | ORAL_TABLET | Freq: Every day | ORAL | Status: DC
  Administered 2013-12-02: 10 mg via ORAL
  Filled 2013-12-02 (×2): qty 1

## 2013-12-02 MED ORDER — IOHEXOL 350 MG/ML SOLN
50.0000 mL | Freq: Once | INTRAVENOUS | Status: AC | PRN
  Administered 2013-12-02: 50 mL via INTRAVENOUS

## 2013-12-02 NOTE — Progress Notes (Signed)
VASCULAR LAB PRELIMINARY  PRELIMINARY  PRELIMINARY  PRELIMINARY  Carotid Dopplers completed.    Preliminary report:  1-39% ICA stenosis.  Vertebral artery flow is antegrade.  KANADY, CANDACE, RVT 12/02/2013, 3:54 PM

## 2013-12-02 NOTE — Progress Notes (Signed)
Occupational Therapy Evaluation Patient Details Name: Brent Webb MRN: [id number] DOB: [birth year] Today's Date: 12/02/2013    History of Present Illness Brent Webb is an 79 y.o. male right pareital cortical hemorrhages x 2 in April 2014. Pt presented to [facility] with CVA like symptoms, work up revealed patient s/p Punctate acute cortical infarct in the right parietal operculum. Per chart - Bells' palsy affected R side of face.   Clinical Impression  PTA, pt independent with ADL and mobility and taught aerobics for seniors. Pt appears at baseline with ADL and mobility. No apparent deficits noted with vision. Discussed compensatory strategies for difficulty with organizing information and STM. No further OT needed. Ready to D/C home with intermittent S when medically stable.  Follow Up Recommendations  No OT follow up;Supervision - Intermittent    Equipment Recommendations  None recommended by OT    Recommendations for Other Services       Precautions / Restrictions Precautions Precautions: None      Mobility Bed Mobility Overal bed mobility: Independent                Transfers Overall transfer level: Independent                    Balance    no apparent balance deficits                             Standardized Balance Assessment Standardized Balance Assessment : Dynamic Gait Index          ADL Overall ADL's : Independent                                       General ADL Comments: Pt at baseline for basic ADL. discussed return to driving. Pt instructed to discuss this with his neurologist. Discussed use of "memory" aids to help with organizing information to decrease level of frustration with keeping up with appts, etc.      Vision    wears glasses at all times                 Perception Perception Perception Tested?: Yes Spatial deficits: no apparent deficits   Praxis Praxis Praxis tested?: Within functional  limits    Pertinent Vitals/Pain No c/o pain. VSS     Hand Dominance Right   Extremity/Trunk Assessment Upper Extremity Assessment Upper Extremity Assessment: Overall WFL for tasks assessed   Lower Extremity Assessment Lower Extremity Assessment: Overall WFL for tasks assessed   Cervical / Trunk Assessment Cervical / Trunk Assessment: Normal   Communication Communication Communication: No difficulties   Cognition Arousal/Alertness: Awake/alert Behavior During Therapy: WFL for tasks assessed/performed Overall Cognitive Status: History of cognitive impairments - at baseline (At baseline pt with difficulty with STM and "multitasking")                     General Comments       Exercises       Shoulder Instructions      Home Living Family/patient expects to be discharged to:: Private residence Living Arrangements: Spouse/significant other Available Help at Discharge: Family Type of Home: House Home Access: Stairs to enter Technical brewer of Steps: 4 Entrance Stairs-Rails: Right Home Layout: Two level     Bathroom Shower/Tub: Occupational psychologist: Standard Bathroom Accessibility: Yes How Accessible: Accessible  via walker Home Equipment: None      Lives With: Spouse    Prior Functioning/Environment Level of Independence: Independent        Comments: teaches senior aerobics    OT Diagnosis:     OT Problem List:     OT Treatment/Interventions:      OT Goals(Current goals can be found in the care plan section) Acute Rehab OT Goals Patient 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 OT Goal Formulation:  (eval only)  OT Frequency:     Barriers to D/C:            Co-evaluation              End of Session Nurse Communication: Mobility status  Activity Tolerance: Patient tolerated treatment well Patient left: in bed;with call bell/phone within reach;with family/visitor present   Time: 1010-1032 OT Time  Calculation (min): 22 min Charges:  OT General Charges $OT Visit: 1 Procedure OT Evaluation $Initial OT Evaluation Tier I: 1 Procedure OT Treatments $Self Care/Home Management : 8-22 mins G-Codes:    WARD,HILLARY 12/16/13, 2:47 PM   [facility], OTR/L  [phone] 2013/12/16

## 2013-12-02 NOTE — Consult Note (Signed)
ELECTROPHYSIOLOGY CONSULT NOTE  Patient ID: Brent Webb MRN: [id number], DOB/AGE: 1935/02/02   Admit date: 11/30/2013 Date of Consult: 12/02/2013  Primary Physician:  Brent Crutch, MD Primary Cardiologist: new to [facility] Reason for Consultation: Cryptogenic stroke; recommendations regarding Implantable Loop Recorder  History of Present Illness Brent Webb was admitted on 11/30/2013 with headache and right facial drooping.  Imaging demonstrated right temporal subcortical intracranial hemorrhage in the setting of old right parietal hemorrhage as well as punctate subacute right parietal opercular ischemic infarct.  He also has history of prior ICH in 2014 and has been followed by neurology as an outpatient and doing well.  He has undergone workup for stroke including echocardiogram and carotid dopplers.  The patient has been monitored on telemetry which has demonstrated sinus rhythm with no arrhythmias.  Inpatient stroke work-up is to be completed with a TEE.   Echocardiogram this admission demonstrated EF 55-60%, no RWMA, LA 39.  Lab work is reviewed and unremarkable.   Prior to admission, the patient denies chest pain, shortness of breath, dizziness, palpitations, or syncope.    EP has been asked to evaluate for placement of an implantable loop recorder to monitor for atrial fibrillation.  ROS is negative except as outlined above.    Past Medical History  Diagnosis Date  . Anxiety   . Stroke 09/17/2012     Surgical History:  Past Surgical History  Procedure Laterality Date  . Cataract extraction    . Shoulder surgery  2012, 2013     Prescriptions prior to admission  Medication Sig Dispense Refill  . levETIRAcetam (KEPPRA XR) 500 MG 24 hr tablet Take 1 tablet (500 mg total) by mouth daily.  90 tablet  3  . Multiple Vitamin (MULTIVITAMIN WITH MINERALS) TABS Take 1 tablet by mouth daily.      Marland Kitchen terbinafine (LAMISIL) 250 MG tablet Take 250 mg by mouth daily.          Inpatient Medications:  . levETIRAcetam  500 mg Oral Daily  . pantoprazole  40 mg Oral QHS  . senna-docusate  1 tablet Oral BID  . simvastatin  10 mg Oral q1800    Allergies: No Known Allergies  History   Social History  . Marital Status: Married    Spouse Name: Brent Webb    Number of Children: 4  . Years of Education: HS   Occupational History  . Not on file.   Social History Main Topics  . Smoking status: Former Smoker    Quit date: 02/08/1986  . Smokeless tobacco: Never Used  . Alcohol Use: Yes     Comment: occasionally  . Drug Use: No  . Sexual Activity: Yes   Other Topics Concern  . Not on file   Social History Narrative   Patient lives at home with his spouse.   Caffeine Use: 3 cups daily     Family History  Problem Relation Age of Onset  . Hypertension Mother   . Hypertension Father     BP 105/55  Pulse 63  Temp(Src) 97.9 F (36.6 C) (Oral)  Resp 19  Ht 5\' 10"  (1.778 m)  Wt 143 lb (64.864 kg)  BMI 20.52 kg/m2  SpO2 100% Well developed and nourished in no acute distress HENT normal Neck supple with JVP-flat Clear Regular rate and rhythm, no murmurs or gallops Abd-soft with active BS No Clubbing cyanosis edema Skin-warm and dry A & Oriented R eye lid lag Labs:   Lab Results  Component Value  Date   WBC 4.8 11/30/2013   HGB 13.7 11/30/2013   HCT 40.4 11/30/2013   MCV 89.2 11/30/2013   PLT 195 11/30/2013    Recent Labs Lab 11/30/13 1340  NA 143  K 5.0  CL 105  CO2 28  BUN 18  CREATININE 1.18  CALCIUM 9.2  GLUCOSE 86     Radiology/Studies: Ct Head Wo Contrast 12/02/2013   CLINICAL DATA:  Followup intracranial hemorrhage  EXAM: CT HEAD WITHOUT CONTRAST  TECHNIQUE: Contiguous axial images were obtained from the base of the skull through the vertex without intravenous contrast.  COMPARISON:  Prior CT and MRI from 11/30/2013  FINDINGS: Previously seen acute subarachnoid hemorrhage within the high right parietal lobe is less conspicuous as  compared to prior exam, compatible with redistribution. No new intracranial hemorrhage. Hypodensity within the posterior right temporal lobe is compatible with previously identified subacute parenchymal hematoma. Again, these may be related to amyloid angiopathy.  Previously seen small acute cortical infarct in the right parietal operculum not definitely seen. No new large vessel territory infarct. No mass lesion or midline shift. No hydrocephalus. No extra-axial fluid collection.  Calvarium is intact.  No acute abnormality seen about the orbits.  Paranasal sinuses and mastoid air cells remain clear.  IMPRESSION: 1. Slight interval decrease in conspicuity of acute right parietal subarachnoid hemorrhage, compatible with redistribution. No new intracranial hemorrhage or infarct identified. 2. Stable subacute hematoma within the posterior right temporal lobe, better evaluated on recent brain MRI.   Electronically Signed   By: Brent Webb M.D.   On: 12/02/2013 06:02   12-lead ECG sinus rhythm, rate 75, normal intervals  Telemetry sinus rhythm with no atrial fibrillation   Assessment and Plan Hemorrhagic infarct x2 Subacute R pariental ischemic stroke   Discussed anticoagulation with neurology team - they felt NOAC would be safe to use if atrial fibrillation identified once ICH resolved.  Procedure reviewed

## 2013-12-02 NOTE — Progress Notes (Signed)
Stroke Team Progress Note  HISTORY Brent Webb is an 79 y.o. 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 patient is followed by Dr. Leonie Man as a out patient and has been doing well since his previous right parietal ICH. 4 days ago 11/26/2013 he noted a HA located int he right occipital region extending up the back of his head. This AM 11/30/2013 he awoke and noted his right corner of his mouth was slightly drooping. He also noted when he placed water in his mouth he tended to drool out of the right corner of his mouth. Other wise he feels his normal baseline. Patient was not a TPA candidate secondary to history of ICH and current ICH. He was admitted to the neuro ICU 3100 for further evaluation and treatment.   SUBJECTIVE Wife at bedside. Patient up in chair. No headaches.   OBJECTIVE Most recent Vital Signs: Filed Vitals:   12/02/13 0800 12/02/13 1000 12/02/13 1100 12/02/13 1156  BP: 124/68 86/64 101/58   Pulse:      Temp: 97.4 F (36.3 C)   97.9 F (36.6 C)  TempSrc: Oral   Oral  Resp: 12 19 20    Height:      Weight:      SpO2: 100% 100% 100%    CBG (last 3)  No results found for this basename: GLUCAP,  in the last 72 hours  IV Fluid Intake:     MEDICATIONS  . levETIRAcetam  500 mg Oral Daily  . pantoprazole  40 mg Oral QHS  . senna-docusate  1 tablet Oral BID   PRN:  acetaminophen, acetaminophen, labetalol  Diet:  General thin liquids Activity:   Up with assistance DVT Prophylaxis:  SCD  CLINICALLY SIGNIFICANT STUDIES Basic Metabolic Panel:   Recent Labs Lab 11/30/13 1340  NA 143  K 5.0  CL 105  CO2 28  GLUCOSE 86  BUN 18  CREATININE 1.18  CALCIUM 9.2   Liver Function Tests: No results found for this basename: AST, ALT, ALKPHOS, BILITOT, PROT, ALBUMIN,  in the last 168 hours CBC:   Recent Labs Lab 11/30/13 1340  WBC 4.8  NEUTROABS 3.5  HGB 13.7   HCT 40.4  MCV 89.2  PLT 195   Coagulation:   Recent Labs Lab 11/30/13 1340  LABPROT 12.4  INR 0.94   Cardiac Enzymes: No results found for this basename: CKTOTAL, CKMB, CKMBINDEX, TROPONINI,  in the last 168 hours Urinalysis: No results found for this basename: COLORURINE, APPERANCEUR, LABSPEC, PHURINE, GLUCOSEU, HGBUR, BILIRUBINUR, KETONESUR, PROTEINUR, UROBILINOGEN, NITRITE, LEUKOCYTESUR,  in the last 168 hours  Lipid Panel     Component Value Date/Time   CHOL 202* 12/02/2013 0224   TRIG 87 12/02/2013 0224   HDL 73 12/02/2013 0224   CHOLHDL 2.8 12/02/2013 0224   VLDL 17 12/02/2013 0224   LDLCALC 112* 12/02/2013 0224    HgbA1C  No results found for this basename: HGBA1C    Urine Drug Screen:   No results found for this basename: labopia,  cocainscrnur,  labbenz,  amphetmu,  thcu,  labbarb    Alcohol Level: No results found for this basename: ETH,  in the last 168 hours  CRP 0.5 (L). Hypercoagulable labs pending    CT of the brain   12/02/2013   1. Slight interval decrease in conspicuity of acute right parietal subarachnoid hemorrhage, compatible with redistribution. No new intracranial hemorrhage or infarct identified. 2. Stable  subacute hematoma within the posterior right temporal lobe, better evaluated on recent brain MRI.    11/30/2013    1. Positive for small volume acute subarachnoid hemorrhage in the high right parietal lobe. 2. Expected evolution of remote right parietal intraparenchymal hemorrhage.    MRI of the brain   W/ & w/o  12/02/2013   W/o  11/30/2013    1. 2.2 cm subacute posterior right temporal subcortical parenchymal hematoma, new from the prior MRI. Previously described right parietal hematomas demonstrate expected interval evolution with associated encephalomalacia. Findings could reflect sequelae of amyloid angiography. 2. Trace acute right parietal subarachnoid hemorrhage as seen on today's CT. 3. Punctate acute cortical infarct in the right parietal  operculum.   CT angio of the brain  12/02/2013  CT angio of the neck 12/02/2013  2D Echocardiogram  12/01/2013 - LVEF 55-60%, mild LVH, normal wall motion, diastolic dysfunction with normal LV filling pressure.  Carotid Doppler  See CT angio of neck  EKG   Normal sinus rhythm. Normal ECG  Therapy Recommendations No PT, ST   Filed Vitals:   12/02/13 0800 12/02/13 1000 12/02/13 1100 12/02/13 1156  BP: 124/68 86/64 101/58   Pulse:      Temp: 97.4 F (36.3 C)   97.9 F (36.6 C)  TempSrc: Oral   Oral  Resp: 12 19 20    Height:      Weight:      SpO2: 100% 100% 100%    GENERAL EXAM: Patient is in no distress; well developed, nourished and groomed; neck is supple  CARDIOVASCULAR: Regular rate and rhythm, no murmurs, no carotid bruits  NEUROLOGIC: MENTAL STATUS: awake, alert, language fluent, comprehension intact, naming intact, fund of knowledge appropriate CRANIAL NERVE: pupils equal and reactive to light, visual fields full to confrontation, extraocular muscles intact, no nystagmus, facial sensation symmetric; EYE BROW RAISE SYMM; DECR RIGHT EYE CLOSURE STRENGTH. DECR RIGHT LOWER FACIAL STRENGTH ON SMILE. Hearing intact, palate elevates symmetrically, uvula midline, shoulder shrug symmetric, tongue midline. MOTOR: normal bulk and tone, full strength in the BUE, BLE SENSORY: normal and symmetric to light touch, temperature, vibration COORDINATION: finger-nose-finger, fine finger movements normal REFLEXES: deep tendon reflexes present and symmetric GAIT/STATION: narrow based gait   ASSESSMENT Brent Webb is a 79 y.o. male presenting with symptoms of a right facial droop on 11/30/13. Also with right HA and right neck pain on 11/16/13 (fluctuating, intermittent). Also with intermittent positional vertigo, dizziness (3-5 seconds) when laying on bed. Imaging shows a right temporal subcortical intracranial hemorrhage in the setting of old right parietal hemorrhage with a small right  parietal SAH, sequela of current hemorrhage as well as punctate subacute right parietal opercular ischemic infarct. Presenting symptoms (right facial weakness) do not correlate with the right temporal hemorrhage or right parietal infarct.    Concern for embolic source of ischemic infarct, hemorrhagic conversion of infarct, vasculitis, carotid dissection or other hypercoagulable state. Also could represent coincidental right Bell's palsy with new intracranial hemorrhage and new ischemic infarct. A small, undetected brainstem/pontine infarct is also possible.  Patient was on no antithrombotics prior to admission; he is now off antithrombotics secondary to hemorrhage. Patient has no resultant neuro symptoms.    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 Repeat MRI 12/01/12 showed significant resolution making underlying tumor or structural lesion unlikely. (he  did see Botero as an OP)  Severe headache June 9, 10, 2015, now resolved.  Hyperlipidemia, LDL 112, on no statin PTA, now on no statin, goal LDL < 100 (< 70 for diabetics)  History of anxiety  Hypercoagulable workup neg thus far, rest of labs pending  Hospital day # 2  TREATMENT/PLAN  No antiplatelet agents given hemorrhage  CT angio head and neck to look at vasculature (dissection, vascular malformation, vasculitis)  MRI brain w & w/o to confirm embolic infarcts, r/o tumor  F/u Blood work for vasculitis workup, hypercoagulable state  F/u occupational therapy evaluation TEE to look for embolic source. Arranged with [facility] for tomorrow.  If positive for PFO (patent foramen ovale), check bilateral lower extremity venous dopplers to rule out DVT as possible source of stroke. (I have made patient NPO  after midnight tonight). If TEE negative, a [facility] electrophysiologist will consult and consider placement of an place implantable loop recorder to evaluate for atrial fibrillation as etiology of stroke. This has been explained to patient/family by Dr. Leta Baptist and they are agreeable. Transfer to the floor  Burnetta Sabin, MSN, RN, ANVP-BC, ANP-BC, GNP-BC [facility] Pager: [phone] 12/02/2013 1:21 PM   I evaluated and examined patient, reviewed records, labs and imaging, and agree with note and plan. Presenting symptoms (right facial weakness) do not correlate with the right temporal hemorrhage or right parietal infarct. Could represent coincidental right Bell's palsy with subacute right temporal intracranial hemorrhage and right parietal ischemic infarct; versus underlying systemic vasculopathy with undetected right pontine (CN7 nucleus) lesion/infarct to explain the right peripheral facial weakness.  Penni Bombard, MD 0/32/1224, 8:25 PM Certified in Neurology, Neurophysiology and Neuroimaging [facility] - Stroke Team  Please refer to [email or web address] for on-call Stroke MD

## 2013-12-03 ENCOUNTER — Encounter (HOSPITAL_COMMUNITY)
Admission: EM | Disposition: A | Discharge status: Home / Self Care | Payer: Self-pay | Source: Home / Self Care | Attending: Neurology

## 2013-12-03 ENCOUNTER — Inpatient Hospital Stay (HOSPITAL_COMMUNITY): Payer: Medicare Other

## 2013-12-03 ENCOUNTER — Encounter (HOSPITAL_COMMUNITY): Payer: Self-pay | Admitting: Gastroenterology

## 2013-12-03 DIAGNOSIS — I6350 Cerebral infarction due to unspecified occlusion or stenosis of unspecified cerebral artery: Secondary | ICD-10-CM

## 2013-12-03 DIAGNOSIS — G510 Bell's palsy: Secondary | ICD-10-CM | POA: Diagnosis present

## 2013-12-03 DIAGNOSIS — I059 Rheumatic mitral valve disease, unspecified: Secondary | ICD-10-CM

## 2013-12-03 DIAGNOSIS — E785 Hyperlipidemia, unspecified: Secondary | ICD-10-CM | POA: Diagnosis present

## 2013-12-03 DIAGNOSIS — H02409 Unspecified ptosis of unspecified eyelid: Secondary | ICD-10-CM | POA: Diagnosis present

## 2013-12-03 HISTORY — PX: LOOP RECORDER IMPLANT: SHX5954

## 2013-12-03 HISTORY — PX: TEE WITHOUT CARDIOVERSION: SHX5443

## 2013-12-03 HISTORY — PX: LOOP RECORDER IMPLANT: SHX5477

## 2013-12-03 LAB — LUPUS ANTICOAGULANT PANEL
DRVVT: 39.3 s (ref <42.9)
Lupus Anticoagulant: NOT DETECTED
PTT LA: 32.5 s (ref 28.0–43.0)

## 2013-12-03 LAB — CARDIOLIPIN ANTIBODY: Phospholipids: 280 mg/dL — ABNORMAL HIGH (ref 151–264)

## 2013-12-03 SURGERY — LOOP RECORDER IMPLANT
Anesthesia: LOCAL

## 2013-12-03 SURGERY — ECHOCARDIOGRAM, TRANSESOPHAGEAL
Anesthesia: Moderate Sedation

## 2013-12-03 MED ORDER — PREDNISONE 50 MG PO TABS
60.0000 mg | ORAL_TABLET | Freq: Every day | ORAL | Status: DC
  Filled 2013-12-03: qty 1

## 2013-12-03 MED ORDER — ARTIFICIAL TEARS OP OINT: TOPICAL_OINTMENT | Freq: Every evening | OPHTHALMIC | Status: DC | PRN

## 2013-12-03 MED ORDER — POLYVINYL ALCOHOL 1.4 % OP SOLN: 1.0000 [drp] | OPHTHALMIC | Status: DC | PRN

## 2013-12-03 MED ORDER — VALACYCLOVIR HCL 500 MG PO TABS
1000.0000 mg | ORAL_TABLET | Freq: Three times a day (TID) | ORAL | Status: DC
  Filled 2013-12-03 (×2): qty 2

## 2013-12-03 MED ORDER — LIDOCAINE-EPINEPHRINE 1 %-1:100000 IJ SOLN
INTRAMUSCULAR | Status: AC
Start: 2013-12-03 — End: 2013-12-03
  Filled 2013-12-03: qty 1

## 2013-12-03 MED ORDER — ARTIFICIAL TEARS OP OINT
TOPICAL_OINTMENT | Freq: Every evening | OPHTHALMIC | Status: DC | PRN
  Filled 2013-12-03: qty 3.5

## 2013-12-03 MED ORDER — MIDAZOLAM HCL 10 MG/2ML IJ SOLN
INTRAMUSCULAR | Status: DC | PRN
  Administered 2013-12-03: 2 mg via INTRAVENOUS

## 2013-12-03 MED ORDER — GADOBENATE DIMEGLUMINE 529 MG/ML IV SOLN
15.0000 mL | Freq: Once | INTRAVENOUS | Status: AC
  Administered 2013-12-03: 15 mL via INTRAVENOUS

## 2013-12-03 MED ORDER — FENTANYL CITRATE 0.05 MG/ML IJ SOLN
INTRAMUSCULAR | Status: DC | PRN
  Administered 2013-12-03: 25 ug via INTRAVENOUS

## 2013-12-03 MED ORDER — POLYVINYL ALCOHOL 1.4 % OP SOLN
1.0000 [drp] | OPHTHALMIC | Status: DC | PRN
  Filled 2013-12-03: qty 15

## 2013-12-03 MED ORDER — SODIUM CHLORIDE 0.9 % IV SOLN
INTRAVENOUS | Status: DC
  Administered 2013-12-03: 500 mL via INTRAVENOUS

## 2013-12-03 MED ORDER — PANTOPRAZOLE SODIUM 40 MG PO TBEC: 40.0000 mg | DELAYED_RELEASE_TABLET | Freq: Every day | ORAL | Status: DC

## 2013-12-03 MED ORDER — VALACYCLOVIR HCL 1 G PO TABS: 1000.0000 mg | ORAL_TABLET | Freq: Three times a day (TID) | ORAL | Status: DC

## 2013-12-03 MED ORDER — FENTANYL CITRATE 0.05 MG/ML IJ SOLN
INTRAMUSCULAR | Status: AC
  Filled 2013-12-03: qty 2

## 2013-12-03 MED ORDER — LIDOCAINE VISCOUS 2 % MT SOLN
OROMUCOSAL | Status: AC
  Filled 2013-12-03: qty 15

## 2013-12-03 MED ORDER — PREDNISONE 20 MG PO TABS: 60.0000 mg | ORAL_TABLET | Freq: Every day | ORAL | Status: DC

## 2013-12-03 MED ORDER — MIDAZOLAM HCL 5 MG/ML IJ SOLN
INTRAMUSCULAR | Status: AC
  Filled 2013-12-03: qty 2

## 2013-12-03 MED ORDER — SIMVASTATIN 10 MG PO TABS: 10.0000 mg | ORAL_TABLET | Freq: Every day | ORAL | Status: DC

## 2013-12-03 NOTE — Progress Notes (Signed)
  Echocardiogram Echocardiogram Transesophageal has been performed.  Brent Webb 12/03/2013, 10:49 AM

## 2013-12-03 NOTE — Progress Notes (Signed)
Stroke Team Progress Note  HISTORY Brent Webb is an 79 y.o. 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 patient is followed by Dr. Leonie Man as a out patient and has been doing well since his previous right parietal ICH. 4 days ago 11/26/2013 he noted a HA located int he right occipital region extending up the back of his head. This AM 11/30/2013 he awoke and noted his right corner of his mouth was slightly drooping. He also noted when he placed water in his mouth he tended to drool out of the right corner of his mouth. Other wise he feels his normal baseline. Patient was not a TPA candidate secondary to history of ICH and current ICH. He was admitted to the neuro ICU 3100 for further evaluation and treatment.   SUBJECTIVE Wife at bedside. Patient up in bed. Back from TEE and loop.   OBJECTIVE Most recent Vital Signs: Filed Vitals:   12/03/13 0830 12/03/13 0845 12/03/13 0855 12/03/13 0905  BP: 108/62 129/60 103/65 105/56  Pulse: 60 59 60 67  Temp:      TempSrc:      Resp: $Remo'17 13 16 17  'PBHJy$ Height:      Weight:      SpO2: 100% 100% 100% 100%   CBG (last 3)  No results found for this basename: GLUCAP,  in the last 72 hours  IV Fluid Intake:     MEDICATIONS  . levETIRAcetam  500 mg Oral Daily  . pantoprazole  40 mg Oral QHS  . senna-docusate  1 tablet Oral BID  . simvastatin  10 mg Oral q1800   PRN:  acetaminophen, acetaminophen, labetalol  Diet:     Activity:   Up with assistance DVT Prophylaxis:  SCD  CLINICALLY SIGNIFICANT STUDIES Basic Metabolic Panel:   Recent Labs Lab 11/30/13 1340  NA 143  K 5.0  CL 105  CO2 28  GLUCOSE 86  BUN 18  CREATININE 1.18  CALCIUM 9.2   Liver Function Tests: No results found for this basename: AST, ALT, ALKPHOS, BILITOT, PROT, ALBUMIN,  in the last 168 hours CBC:   Recent Labs Lab 11/30/13 1340  WBC 4.8  NEUTROABS 3.5  HGB 13.7   HCT 40.4  MCV 89.2  PLT 195   Coagulation:   Recent Labs Lab 11/30/13 1340  LABPROT 12.4  INR 0.94   Cardiac Enzymes: No results found for this basename: CKTOTAL, CKMB, CKMBINDEX, TROPONINI,  in the last 168 hours Urinalysis: No results found for this basename: COLORURINE, APPERANCEUR, LABSPEC, PHURINE, GLUCOSEU, HGBUR, BILIRUBINUR, KETONESUR, PROTEINUR, UROBILINOGEN, NITRITE, LEUKOCYTESUR,  in the last 168 hours  Lipid Panel     Component Value Date/Time   CHOL 202* 12/02/2013 0224   TRIG 87 12/02/2013 0224   HDL 73 12/02/2013 0224   CHOLHDL 2.8 12/02/2013 0224   VLDL 17 12/02/2013 0224   LDLCALC 112* 12/02/2013 0224    HgbA1C  Lab Results  Component Value Date   HGBA1C 5.4 12/02/2013    Urine Drug Screen:   No results found for this basename: labopia,  cocainscrnur,  labbenz,  amphetmu,  thcu,  labbarb    Alcohol Level: No results found for this basename: ETH,  in the last 168 hours  CRP 0.5 (L). Hypercoagulable labs pending Normal - ESR, ANA, B burgdorfi ab  CT of the brain   12/02/2013 Contracting subacute right temporal parietal intraparenchymal hematoma, in addition trace residual right  parietal subarachnoid blood versus thrombosed cortical  vein. If clinically indicated, findings could be further characterized with MR of the of the brain. No acute intracranial process. Remote right parietal infarct. 12/02/2013   1. Slight interval decrease in conspicuity of acute right parietal subarachnoid hemorrhage, compatible with redistribution. No new intracranial hemorrhage or infarct identified. 2. Stable subacute hematoma within the posterior right temporal lobe, better evaluated on recent brain MRI.    11/30/2013    1. Positive for small volume acute subarachnoid hemorrhage in the high right parietal lobe. 2. Expected evolution of remote right parietal intraparenchymal hemorrhage.    MRI of the brain   W/ & w/o  12/02/2013   W/o  11/30/2013    1. 2.2 cm subacute posterior right  temporal subcortical parenchymal hematoma, new from the prior MRI. Previously described right parietal hematomas demonstrate expected interval evolution with associated encephalomalacia. Findings could reflect sequelae of amyloid angiography. 2. Trace acute right parietal subarachnoid hemorrhage as seen on today's CT. 3. Punctate acute cortical infarct in the right parietal operculum.   CT angio of the brain  12/02/2013 No acute vascular process or pain hemodynamically significant stenosis.  CT angio of the neck 12/02/2013 No acute vascular process or pain hemodynamically significant stenosis.  2D Echocardiogram  12/01/2013 - LVEF 55-60%, mild LVH, normal wall motion, diastolic dysfunction with normal LV filling pressure.  Carotid Doppler  No evidence of hemodynamically significant internal carotid artery stenosis. Vertebral artery flow is antegrade.   EKG   Normal sinus rhythm. Normal ECG  Therapy Recommendations No PT, ST, OT   Filed Vitals:   12/03/13 0830 12/03/13 0845 12/03/13 0855 12/03/13 0905  BP: 108/62 129/60 103/65 105/56  Pulse: 60 59 60 67  Temp:      TempSrc:      Resp: $Remo'17 13 16 17  'FMgUh$ Height:      Weight:      SpO2: 100% 100% 100% 100%   GENERAL EXAM: Patient is in no distress; well developed, nourished and groomed; neck is supple  CARDIOVASCULAR: Regular rate and rhythm, no murmurs, no carotid bruits  NEUROLOGIC: MENTAL STATUS: awake, alert, language fluent, comprehension intact, naming intact, fund of knowledge appropriate CRANIAL NERVE: pupils equal and reactive to light, visual fields full to confrontation, extraocular muscles intact, no nystagmus, facial sensation symmetric; EYE BROW RAISE SYMM; DECR RIGHT EYE CLOSURE STRENGTH. DECR RIGHT LOWER FACIAL STRENGTH ON SMILE. Hearing intact, palate elevates symmetrically, uvula midline, shoulder shrug symmetric, tongue midline. MOTOR: normal bulk and tone, full strength in the BUE, BLE SENSORY: normal and symmetric to  light touch, temperature, vibration COORDINATION: finger-nose-finger, fine finger movements normal REFLEXES: deep tendon reflexes present and symmetric GAIT/STATION: narrow based gait   ASSESSMENT [name] is a 79 y.o. male presenting with symptoms of a right facial droop on 11/30/13. Also with right HA and right neck pain on 11/16/13 (fluctuating, intermittent). Also with intermittent positional vertigo, dizziness (3-5 seconds) when laying on bed. Imaging shows a right temporal subcortical intracranial hemorrhage in the setting of old right parietal hemorrhage with a small right parietal SAH, sequela of current hemorrhage as well as punctate subacute right parietal opercular ischemic infarct. Presenting symptoms (right facial weakness) do not correlate with the right temporal hemorrhage or right parietal infarct.    Concern for embolic source of ischemic infarct, hemorrhagic conversion of infarct, vasculitis, carotid dissection or other hypercoagulable state. Also could represent coincidental right Bell's palsy with new intracranial hemorrhage and new ischemic infarct. A small, undetected  brainstem/pontine infarct is also possible.  Patient was on no antithrombotics prior to admission; he is now off antithrombotics secondary to hemorrhage. Patient has resultant right eye ptosis, tearing.   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 Repeat MRI 12/01/12 showed significant resolution making underlying tumor or structural lesion unlikely. (he did see Botero as an OP)  Severe headache June 9, 10, 2015, now resolved.  Hyperlipidemia, LDL 112, on no statin PTA, now on no statin, goal LDL < 100 (< 70 for diabetics)  History of anxiety  Hypercoagulable workup neg thus far, some labs remain  pending  Hospital day # 3  TREATMENT/PLAN  No antiplatelet agents given hemorrhage  MRI brain w & w/o to confirm embolic infarcts, r/o tumor  F/u Blood work for vasculitis workup, hypercoagulable state  Artificial tears to eyes during the day, lacrilube at hs. Follow up with eye MD as OP.  Discharge home after MRI completed & reviewed.  Burnetta Sabin, MSN, RN, ANVP-BC, ANP-BC, [facility] Pager: [phone] 12/03/2013 9:54 AM   I evaluated and examined patient, reviewed records, labs and imaging, and agree with note and plan. Presenting symptoms (right facial weakness) do not correlate with the right temporal hemorrhage or right parietal infarct. Could represent coincidental right Bell's palsy with subacute right temporal intracranial hemorrhage and right parietal ischemic infarct; versus underlying systemic vasculopathy, inflamm, autoimmune process right CN7 cranial neuropathy. Will start prednisone + valacyclovir for bell's palsy possibility.   Penni Bombard, MD 4/70/9295, 7:47 AM Certified in Neurology, Neurophysiology and Neuroimaging [facility] - Stroke Team  Please refer to [email or web address] for on-call Stroke MD

## 2013-12-03 NOTE — CV Procedure (Signed)
Pre op Dx CRYPTOGENIC STROKE Post op Dx  SAME  Procedure  Loop Recorder implantation  After routine prep and drape of the left parasternal area, a small incision was created. A [employer] LINQ Reveal Loop Recorder  Serial Number  [id number] was inserted.    SteriStrip dressing was  applied.  The patient tolerated the procedure without apparent complication.

## 2013-12-03 NOTE — CV Procedure (Signed)
    TRANSESOPHAGEAL ECHOCARDIOGRAM (TEE) NOTE  INDICATIONS: cryptogenic stroke  PROCEDURE:   Informed consent was obtained prior to the procedure. The risks, benefits and alternatives for the procedure were discussed and the patient comprehended these risks.  Risks include, but are not limited to, cough, sore throat, vomiting, nausea, somnolence, esophageal and stomach trauma or perforation, bleeding, low blood pressure, aspiration, pneumonia, infection, trauma to the teeth and death.    After a procedural time-out, the patient was given 2 mg versed and 25 mcg fentanyl for moderate sedation.  The oropharynx was anesthetized 10 cc of topical 1% viscous lidocaine and 1 cetacaine spray.  The transesophageal probe was inserted in the esophagus and stomach without difficulty and multiple views were obtained.  The patient was kept under observation until the patient left the procedure room.  The patient left the procedure room in stable condition.   Agitated microbubble saline contrast was administered.  COMPLICATIONS:    There were no immediate complications.  Findings:  1. LEFT VENTRICLE: The left ventricular wall thickness is normal.  The left ventricular cavity is normal in size. Wall motion is normal.  LVEF is 55-60%.  2. RIGHT VENTRICLE:  The right ventricle is normal in structure and function without any thrombus or masses.    3. LEFT ATRIUM:  The left atrium is normal in size without any thrombus or masses.  There is not spontaneous echo contrast ("smoke") in the left atrium consistent with a low flow state.  4. LEFT ATRIAL APPENDAGE:  The left atrial appendage is free of any thrombus or masses. The appendage has single lobes. Pulse doppler indicates high flow in the appendage.  5. ATRIAL SEPTUM:  The atrial septum appears intact and is free of thrombus and/or masses.  There is no evidence for interatrial shunting by color doppler and saline microbubble.  6. RIGHT ATRIUM:  The right  atrium is normal in size and function without any thrombus or masses.  7. MITRAL VALVE:  The mitral valve is normal in structure and function with trivial regurgitation.  There were no vegetations or stenosis.  8. AORTIC VALVE:  The aortic valve is normal in structure and function with trivial central regurgitation.  There were no vegetations or stenosis  9. TRICUSPID VALVE:  The tricuspid valve is normal in structure and function with trivial regurgitation.  There were no vegetations or stenosis  10.  PULMONIC VALVE:  The pulmonic valve is normal in structure and function with trace to mild regurgitation.  The RVSP was 30 mmHg + RAP. There were no vegetations or stenosis.   11. AORTIC ARCH, ASCENDING AND DESCENDING AORTA:  There was no atherosclerosis of the ascending aorta, aortic arch, or proximal descending aorta.  12. PULMONARY VEINS: Anomalous pulmonary venous return was not noted.  13. PERICARDIUM: The pericardium appeared normal and non-thickened.  There is a no pericardial effusion.  IMPRESSION:   1. No LAA thrombus or evidence for recent intracardiac thrombus. 2. No PFO by saline microbubble study with provocation. 3.   Trivial MR, AI, PI and trace to mild MR with top normal RVSP. 4.   Normal LV function and wall motion.  RECOMMENDATIONS:    1. No cardiac source of stroke is identified.  Time Spent Directly with the Patient:  30 minutes   Pixie Casino, MD, [facility] Attending Cardiologist [facility]  12/03/2013, 8:35 AM

## 2013-12-03 NOTE — Discharge Summary (Signed)
Stroke Discharge Summary  Patient ID: Brent Webb   MRN: [id number]      DOB: 1934-12-26  Date of Admission: 11/30/2013 Date of Discharge: 12/03/2013  Attending Physician:  Suzzanne Cloud, MD, Stroke MD  Consulting Physician(s):   Treatment Team:  [facility] Stroke, MD [facility], MD   Patient's PCP:   Melinda Crutch, MD  Discharge Diagnoses:  Principal Problem:   Stroke due to intracerebral hemorrhage - right temporal subcortical intracranial hemorrhage in the setting of old right parietal hemorrhage with a small right parietal SAH,  Active Problems:   SAH (subarachnoid hemorrhage)   right opercular cerebral ischemic infarct, punctate    Hyperlipidemia   Possible Right Bell's palsy   Severe headache now resolved.   Right eye ptosis  BMI: Body mass index is 20.52 kg/(m^2).  Past Medical History  Diagnosis Date  . Anxiety   . Stroke 09/17/2012   Past Surgical History  Procedure Laterality Date  . Cataract extraction    . Shoulder surgery  2012, 2013  . Loop recorder implant  12-03-2013    MDT LINQ implanted by Dr Caryl Comes for cryptogenic stroke      Medication List         artificial tears Oint ophthalmic ointment  Place into the right eye at bedtime as needed for dry eyes. To prevent corneal scarring while eyelid is unable to close     levETIRAcetam 500 MG 24 hr tablet  Commonly known as:  KEPPRA XR  Take 1 tablet (500 mg total) by mouth daily.     multivitamin with minerals Tabs tablet  Take 1 tablet by mouth daily.     pantoprazole 40 MG tablet  Commonly known as:  PROTONIX  Take 1 tablet (40 mg total) by mouth at bedtime.     polyvinyl alcohol 1.4 % ophthalmic solution  Commonly known as:  LIQUIFILM TEARS  Place 1 drop into the right eye as needed for dry eyes.     predniSONE 20 MG tablet  Commonly known as:  DELTASONE  Take 3 tablets (60 mg total) by mouth daily with breakfast.  Start taking on:  12/04/2013     simvastatin 10 MG tablet  Commonly  known as:  ZOCOR  Take 1 tablet (10 mg total) by mouth daily at 6 PM.     terbinafine 250 MG tablet  Commonly known as:  LAMISIL  Take 250 mg by mouth daily.     valACYclovir 1000 MG tablet  Commonly known as:  VALTREX  Take 1 tablet (1,000 mg total) by mouth 3 (three) times daily.        LABORATORY STUDIES CBC    Component Value Date/Time   WBC 4.8 11/30/2013 1340   RBC 4.53 11/30/2013 1340   HGB 13.7 11/30/2013 1340   HCT 40.4 11/30/2013 1340   PLT 195 11/30/2013 1340   MCV 89.2 11/30/2013 1340   MCH 30.2 11/30/2013 1340   MCHC 33.9 11/30/2013 1340   RDW 12.4 11/30/2013 1340   LYMPHSABS 0.9 11/30/2013 1340   MONOABS 0.4 11/30/2013 1340   EOSABS 0.1 11/30/2013 1340   BASOSABS 0.0 11/30/2013 1340   CMP    Component Value Date/Time   NA 143 11/30/2013 1340   K 5.0 11/30/2013 1340   CL 105 11/30/2013 1340   CO2 28 11/30/2013 1340   GLUCOSE 86 11/30/2013 1340   BUN 18 11/30/2013 1340   CREATININE 1.18 11/30/2013 1340   CALCIUM 9.2 11/30/2013 1340  PROT 6.8 09/17/2012 1210   ALBUMIN 4.0 09/17/2012 1210   AST 22 09/17/2012 1210   ALT 17 09/17/2012 1210   ALKPHOS 53 09/17/2012 1210   BILITOT 0.8 09/17/2012 1210   GFRNONAA 58* 11/30/2013 1340   GFRAA 67* 11/30/2013 1340   COAGS Lab Results  Component Value Date   INR 0.94 11/30/2013   INR 0.93 09/17/2012   INR 0.91 02/27/2011   Lipid Panel    Component Value Date/Time   CHOL 202* 12/02/2013 0224   TRIG 87 12/02/2013 0224   HDL 73 12/02/2013 0224   CHOLHDL 2.8 12/02/2013 0224   VLDL 17 12/02/2013 0224   LDLCALC 112* 12/02/2013 0224   HgbA1C  Lab Results  Component Value Date   HGBA1C 5.4 12/02/2013   Cardiac Panel (last 3 results) No results found for this basename: CKTOTAL, CKMB, TROPONINI, RELINDX,  in the last 72 hours Urinalysis    Component Value Date/Time   COLORURINE YELLOW 09/17/2012 1304   APPEARANCEUR CLEAR 09/17/2012 1304   LABSPEC 1.016 09/17/2012 1304   PHURINE 7.5 09/17/2012 1304   GLUCOSEU NEGATIVE 09/17/2012 1304    HGBUR NEGATIVE 09/17/2012 1304   BILIRUBINUR NEGATIVE 09/17/2012 1304   KETONESUR 15* 09/17/2012 1304   PROTEINUR NEGATIVE 09/17/2012 1304   UROBILINOGEN 0.2 09/17/2012 1304   NITRITE NEGATIVE 09/17/2012 1304   LEUKOCYTESUR TRACE* 09/17/2012 1304   Urine Drug Screen  No results found for this basename: labopia, cocainscrnur, labbenz, amphetmu, thcu, labbarb    Alcohol Level No results found for this basename: eth     SIGNIFICANT DIAGNOSTIC STUDIES CRP 0.5 (L). Hypercoagulable labs pending  Normal - ESR, ANA, B burgdorfi ab  CT of the brain  12/02/2013 Contracting subacute right temporal parietal intraparenchymal hematoma, in addition trace residual right parietal subarachnoid blood versus thrombosed cortical vein. If clinically indicated, findings could be further characterized with MR of the of the brain. No acute intracranial process. Remote right parietal infarct.  12/02/2013 1. Slight interval decrease in conspicuity of acute right parietal subarachnoid hemorrhage, compatible with redistribution. No new intracranial hemorrhage or infarct identified. 2. Stable subacute hematoma within the posterior right temporal lobe, better evaluated on recent brain MRI.  11/30/2013 1. Positive for small volume acute subarachnoid hemorrhage in the high right parietal lobe. 2. Expected evolution of remote right parietal intraparenchymal hemorrhage.  MRI of the brain  W/ & w/o 12/02/2013 No abnormal enhancement related to the acute to subacute intracranial processes. No underlying mass lesion or sinovenous occlusive disease. Favor cerebral amyloid angiopathy as cause for lobar hemorrhages in this patient.  W/o 11/30/2013 1. 2.2 cm subacute posterior right temporal subcortical parenchymal hematoma, new from the prior MRI. Previously described right parietal hematomas demonstrate expected interval evolution with associated encephalomalacia. Findings could reflect sequelae of amyloid angiography. 2. Trace acute right  parietal subarachnoid hemorrhage as seen on today's CT. 3. Punctate acute cortical infarct in the right parietal operculum.  CT angio of the brain 12/02/2013 No acute vascular process or pain hemodynamically significant stenosis.  CT angio of the neck 12/02/2013 No acute vascular process or pain hemodynamically significant stenosis.  2D Echocardiogram 12/01/2013 - LVEF 55-60%, mild LVH, normal wall motion, diastolic dysfunction with normal LV filling pressure.  Carotid Doppler No evidence of hemodynamically significant internal carotid artery stenosis. Vertebral artery flow is antegrade.  EKG Normal sinus rhythm. Normal ECG    History of Present Illness   Brent Webb is an 79 y.o. male right pareital cortical hemorrhages x 2 in April 2014  of unclear etiology-cortical vein thrombosis versus amyloid angiopathy and symptomatic seizures. patient is followed by Dr. Leonie Man as a out patient and has been doing well since his previous right parietal ICH. 4 days ago 11/26/2013 he noted a HA located int he right occipital region extending up the back of his head. This AM 11/30/2013 he awoke and noted his right corner of his mouth was slightly drooping. He also noted when he placed water in his mouth he tended to drool out of the right corner of his mouth. Other wise he feels his normal baseline. Patient was not a TPA candidate secondary to history of ICH and current ICH. He was admitted to the neuro ICU 3100 for further evaluation and treatment.   Hospital Course patient with symptoms of a right facial droop on 11/30/13, right HA and right neck pain on 11/16/13 (fluctuating, intermittent), also with intermittent positional vertigo, dizziness (3-5 seconds) when laying on bed. Imaging shows a right temporal subcortical intracranial hemorrhage in the setting of old right parietal hemorrhage with a small right parietal SAH, sequela of current hemorrhage as well as punctate subacute right parietal opercular ischemic infarct.  Presenting symptoms (right facial weakness) do not correlate with the right temporal hemorrhage or right parietal infarct.   Concern for embolic source of ischemic infarct, hemorrhagic conversion of infarct, vasculitis, carotid dissection or other hypercoagulable state. Also could represent coincidental right Bell's palsy with new intracranial hemorrhage and new ischemic infarct. A small, undetected brainstem/pontine infarct is also possible. Hypercoagulable workup neg thus far, some labs remain pending. MRI with and without negative for enhancing lesion.  Patient was on no antithrombotics prior to admission; he is now off antithrombotics secondary to hemorrhage. Patient has resultant right eye ptosis, tearing.   Patient with vascular risk factors of:  Hx stroke - right pareital cortical hemorrhages x 2 in April 2014 of unclear etiology-cortical vein thrombosis versus amyloid angiopathy and symptomatic seizures, on keppra. Repeat MRI 12/01/12 showed significant resolution making underlying tumor or structural lesion unlikely. (he did see [facility] as an OP)  Hyperlipidemia, LDL 112, on no statin PTA, now on no statin, goal LDL < 100 (< 70 for diabetics)  Patient also with: Possible Right Bell's palsy, started on valcylovir and prednisone at discharge x 1 week, accompanied by PPI x 1 week. Artificial tears to right eye during the day, lacrilube at hs. Follow up with eye MD as OP.  Severe headache June 9, 10, 2015, now resolved.  Patient with continued stroke symptoms of right eye ptosis. Physical therapy, occupational therapy and speech therapy evaluated patient. They recommend no followup therapy needs. He was advised to avoid aerobic activity x 10 days. Will discuss plan with Dr. Leonie Man at follow up appt.  GENERAL EXAM:  Blood pressure 106/47, pulse 69, temperature 97.6 F (36.4 C), temperature source Oral, resp. rate 23, height $RemoveBe'5\' 10"'[id number]$  (1.778 m), weight 64.864 kg (143 lb), SpO2 100.00%.  Patient is in  no distress; well developed, nourished and groomed; neck is supple   CARDIOVASCULAR:  Regular rate and rhythm, no murmurs, no carotid bruits  NEUROLOGIC:  MENTAL STATUS: awake, alert, language fluent, comprehension intact, naming intact, fund of knowledge appropriate  CRANIAL NERVE: pupils equal and reactive to light, visual fields full to confrontation, extraocular muscles intact, no nystagmus, facial sensation symmetric; EYE BROW RAISE SYMM; DECR RIGHT EYE CLOSURE STRENGTH. DECR RIGHT LOWER FACIAL STRENGTH ON SMILE. Hearing intact, palate elevates symmetrically, uvula midline, shoulder shrug symmetric, tongue midline.  MOTOR: normal bulk  and tone, full strength in the BUE, BLE  SENSORY: normal and symmetric to light touch, temperature, vibration  COORDINATION: finger-nose-finger, fine finger movements normal  REFLEXES: deep tendon reflexes present and symmetric  GAIT/STATION: narrow based gait   Discharge Diet   General thin liquids  Discharge Plan    Disposition:  Home with wife  No antiplatelet agents given hemorrhage   F/u Blood work for vasculitis workup, hypercoagulable state   Ongoing risk factor control by Primary Care Physician.  Avoid excessive aerobic activity x 10 days. Dr. Leonie Man will address ongoing activity at that time  Right eye protection given ptosis  Valcylovir 1000 tid and prednisone 60 mg daily with PPI x 7 days for bell's palsy  Follow-up  Melinda Crutch, MD in 2 weeks.  Follow-up with Dr. Antony Contras, [facility] in previously scheduled appt, 12/15/2013  40 minutes were spent preparing discharge.  Signed Burnetta Sabin, MSN, RN, ANVP-BC, ANP-BC, GNP-BC [facility] Pager: [phone] 12/03/2013 5:49 PM   I have personally examined this patient, reviewed pertinent data and developed the plan of care. I agree with above.  Penni Bombard, MD 11/27/5091, 2:67 PM Certified in Neurology, Neurophysiology and Neuroimaging [facility] - Stroke Team

## 2013-12-03 NOTE — H&P (Signed)
     INTERVAL PROCEDURE H&P  History and Physical Interval Note:  12/03/2013 8:02 AM  Brent Webb has presented today for their planned procedure. The various methods of treatment have been discussed with the patient and family. After consideration of risks, benefits and other options for treatment, the patient has consented to the procedure.  The patients' outpatient history has been reviewed, patient examined, and no change in status from most recent office note within the past 30 days. I have reviewed the patients' chart and labs and will proceed as planned. Questions were answered to the patient's satisfaction.   Pixie Casino, MD, [facility] Attending Cardiologist [facility]  HILTY,Kenneth C 12/03/2013, 8:02 AM

## 2013-12-04 LAB — S CEREVISIAE ABS
S CEREVISIAE IGA AB: 4.6 U (ref <=20.0)
S cerevisiae IgG ab: 6 U (ref <=20.0)

## 2013-12-06 ENCOUNTER — Encounter (HOSPITAL_COMMUNITY): Payer: Self-pay | Admitting: Internal Medicine

## 2013-12-06 ENCOUNTER — Telehealth: Payer: Self-pay | Admitting: *Deleted

## 2013-12-06 LAB — FACTOR 5 LEIDEN

## 2013-12-06 NOTE — Telephone Encounter (Signed)
Pt called for clarification about home monitoring and wound appointment on 12/15/13. Pt also needed clarification for international travel. Pt understands to leave steri-strips on until wound check appt.

## 2013-12-15 ENCOUNTER — Ambulatory Visit (INDEPENDENT_AMBULATORY_CARE_PROVIDER_SITE_OTHER): Payer: Medicare Other | Admitting: Neurology

## 2013-12-15 ENCOUNTER — Encounter: Payer: Self-pay | Admitting: Neurology

## 2013-12-15 ENCOUNTER — Ambulatory Visit (INDEPENDENT_AMBULATORY_CARE_PROVIDER_SITE_OTHER): Payer: Medicare Other | Admitting: *Deleted

## 2013-12-15 VITALS — BP 130/74 | HR 66 | Ht 68.5 in | Wt 143.8 lb

## 2013-12-15 DIAGNOSIS — I611 Nontraumatic intracerebral hemorrhage in hemisphere, cortical: Secondary | ICD-10-CM

## 2013-12-15 DIAGNOSIS — I6350 Cerebral infarction due to unspecified occlusion or stenosis of unspecified cerebral artery: Secondary | ICD-10-CM

## 2013-12-15 DIAGNOSIS — I619 Nontraumatic intracerebral hemorrhage, unspecified: Secondary | ICD-10-CM

## 2013-12-15 DIAGNOSIS — I6389 Other cerebral infarction: Secondary | ICD-10-CM

## 2013-12-15 LAB — MDC_IDC_ENUM_SESS_TYPE_INCLINIC
Date Time Interrogation Session: 20150708155724
MDC IDC SET ZONE DETECTION INTERVAL: 3000 ms
MDC IDC SET ZONE DETECTION INTERVAL: 390 ms
Zone Setting Detection Interval: 2000 ms

## 2013-12-15 NOTE — Progress Notes (Signed)
Wound check appointment. Steri-strips removed. Wound without redness or edema. Incision edges approximated, wound well healed. Normal device function. Pt with 0 tachy episodes; 0 brady episodes; 0 asystole. Monthly carelink summary reports & ROV w/ Dr. Caryl Comes in 36mo.

## 2013-12-15 NOTE — Patient Instructions (Signed)
I had a long discussion the patient and his wife with regards to his recurrent right temporoparietal intracerebral hemorrhage and recent right facial weakness which seems to have improved. I reviewed recent imaging films as well as lab results and communicated results to the patient and his wife. I recommend further evaluation with cerebral catheter angiogram to look for any underlying AVM/venous abnormality. Continue Keppra and present treatment. Return for followup in 4 weeks or call earlier if necessary

## 2013-12-15 NOTE — Progress Notes (Signed)
PATIENT: Brent Webb DOB: February 09, 1935  REASON FOR VISIT: Urgent  follow up for  recurrent ICH HISTORY FROM: patient  HISTORY OF PRESENT ILLNESS: He is seen urgently today following recent hospital admission for headache and recurrent intracerebral hemorrhage. Patient was admitted on 11/30/13 with a two-week history of right occipital and retro-auricular headache off and on. On exam he was found to have mild right facial weakness with some involvement of upper face as well and it was not clear whether he had Bell's palsy or a small brain stem lesion however CT scan of the head showed small right parietal subarachnoid hemorrhage and expected evolution of previous right parietal hematoma from June 2014. MRI scan surprisingly showed a subacute right posterior temporal hematoma which was adjacent to the previous old hematoma from a year ago. Repeat MRI with contrast was obtained but did not show any underlying structural lesion or mass lesion. CT angiogram revealed no evidence of aneurysms or AVM. Patient was started on prednisone and valacyclovir for presumed Bell's palsy. Lab work for [employer] disease, ANA, ESR were negative. Hypercoagulable panel labs were also negative except for minimally elevated anticardiolipin antibody. Patient's MRI had also shown a small diffusion-positive lesions in the right insula and hence it was felt that his hemorrhage may represent a hemorrhagic infarct and he underwent a loop recorder implant detector of systolic fibrillation. The patient states that his son well since discharge his right facial weakness is completely resolved. His blood pressure has been under good control. He does admit to mild short-term memory and multitasking difficulties from his initial hemorrhage in June 2014 but these do not seem to be progressive. 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  He is seen today for f/u after hospital admission on 09/17/12 with right pareital ICH he presented with sensory seizures involving left hemibody x 2. CT and MRI showed 2 separate nearby areas of 5 x 3 x3 cm and 1.3 x 1 x 1.2 cm right pareital brain hemorrhage with mild mass effect and subarachnoid blood but no hydrocephalous or midline shift. BP was not elevated. No avm/aneurysms were found on MRA. Urine drug screen was negative.He was started on keppra for seizures.he has done well and reports no physical deficits but has noted decrease multitasking, organizational skills,and mild cognitive difficulties.He had f/u MRI done on 11/27/12 showing significant resolution of hematoma without any underlying tumor or vascular lesion.he is tolerating keppra well.   UPDATE 03/18/13 (LL): [name] comes to office for [place] and seizure revisit. He is doing very well, no recurrent seizure activity. Only thing that bothers him is slight memory problems. He is very active; teaches senior aerobics. He takes no other medications other than Keppra and a multivitamin.   UPDATE 09/16/13 (LL): [name] comes to office for [place] and seizure revisit. He is doing well, very active, still leading aerobics classes and senior hikes.  No recurrent seizures.  Tolerating LVT ER 500 mg without problems.  REVIEW OF SYSTEMS: Full 14 system review of systems performed and notable only for: memory loss.  ALLERGIES: No Known Allergies  HOME MEDICATIONS: Outpatient Prescriptions Prior to Visit  Medication Sig Dispense Refill  . levETIRAcetam (KEPPRA XR) 500 MG 24 hr tablet Take  1 tablet (500 mg total) by mouth daily.  90 tablet  3  . Multiple Vitamin (MULTIVITAMIN WITH MINERALS) TABS Take 1 tablet by mouth daily.      . simvastatin (ZOCOR) 10 MG tablet Take 1 tablet (10 mg total) by  mouth daily at 6 PM.  30 tablet  2  . terbinafine (LAMISIL) 250 MG tablet Take 250 mg by mouth daily.       Marland Kitchen artificial tears (LACRILUBE) OINT ophthalmic ointment Place into the right eye at bedtime as needed for dry eyes. To prevent corneal scarring while eyelid is unable to close  1 Tube  2  . pantoprazole (PROTONIX) 40 MG tablet Take 1 tablet (40 mg total) by mouth at bedtime.  7 tablet  0  . polyvinyl alcohol (LIQUIFILM TEARS) 1.4 % ophthalmic solution Place 1 drop into the right eye as needed for dry eyes.  15 mL  0  . predniSONE (DELTASONE) 20 MG tablet Take 3 tablets (60 mg total) by mouth daily with breakfast.  7 tablet  0  . valACYclovir (VALTREX) 1000 MG tablet Take 1 tablet (1,000 mg total) by mouth 3 (three) times daily.  7 tablet  0   No facility-administered medications prior to visit.     PHYSICAL EXAM  Filed Vitals:   12/15/13 1017  BP: 130/74  Pulse: 66  Height: 5' 8.5" (1.74 m)  Weight: 143 lb 12.8 oz (65.227 kg)   Body mass index is 21.54 kg/(m^2).  General: well developed, well nourished, seated, in no evident distress. Head: head normocephalic and atraumatic. Orohparynx benign  Neck: supple with no carotid or supraclavicular bruits  Cardiovascular: regular rate and rhythm, no murmurs  Musculoskeletal: no deformity  Filed Vitals:   12/15/13 1017  BP: 130/74  Pulse: 66    Neurologic Exam  Mental Status: Awake and fully alert. Oriented to place and time. Recent and remote memory diminished. Attention span, concentration and fund of knowledge appropriate. Mood and affect appropriate. Mini-Mental status exam scored 29/30 clock drawing 4/4. Geriatric depression scale 1 only. Animal naming test end. Cranial Nerves: Pupils equal, briskly reactive to light. Extraocular movements full without nystagmus. Visual fields full to confrontation. Hearing intact. Facial sensation intact.able to close eyes well and no facial weakness. Face, tongue, palate moves normally and  symmetrically.  Motor: Normal bulk and tone. Normal strength in all tested extremity muscles.  Sensory: intact to touch and pinprick all 4 extremities Coordination: Rapid alternating movements normal in all extremities. Finger-to-nose and heel-to-shin performed accurately bilaterally.  Gait and Station: Arises from chair without difficulty. Stance is normal. Gait demonstrates normal stride length and balance . Able to heel, toe and tandem walk without difficulty.  Reflexes: 1+ and symmetric.   ASSESSMENT AND PLAN 35 year Caucasian male with recurrent right pareital cortical hemorrhages x 2 in April 2014 and June 2015 of unclear etiology-cortical vein thrombosis versus amyloid angiopathy and symptomatic seizures.Transient right facial weakness now resolved ? Lower motor neuron pattern difficult to explain based on MRI findings and may represent incidental Bell's palsy or small brain stem lesion not visualized on MRI. Amyloid angiopathy has been suggested as a possible etiology for his hemorrhages but his clinical presentation and exam is not consistent with it. PLAN:  I had a long discussion the patient and his wife with regards to his recurrent right temporoparietal intracerebral hemorrhage and recent right facial weakness which seems to have improved. I reviewed recent imaging films as well as lab results and communicated results  to the patient and his wife. I recommend further evaluation with cerebral catheter angiogram to look for any underlying AVM/venous abnormality. If this is unyielding may need to consider doing spinal tap for CNS vasculitis Continue Keppra and present treatment. Return for followup in 4 weeks or call earlier if necessary    Antony Contras, MD  12/15/2013, 11:55 AM [facility] [address] [place], [place] [address] [phone]  Note: This document was prepared with digital dictation and possible smart phrase technology. Any transcriptional errors  that result from this process are unintentional.

## 2013-12-22 ENCOUNTER — Telehealth: Payer: Self-pay | Admitting: Neurology

## 2013-12-22 NOTE — Telephone Encounter (Signed)
Patient calling to state that he needs to have that Angio test scheduled at [facility] before he comes to see Dr. Leonie Man in August, patient called the hospital to see if it has been scheduled but it has not. Please return call and advise.

## 2013-12-23 NOTE — Telephone Encounter (Signed)
I called and spoke to [facility] with Dr.Deveshwar.  She did see the order.  Pt has medicare A and B.  (as well as [employer]).  I will call [employer].  Anderson Malta to call pt to schedule.

## 2013-12-23 NOTE — Telephone Encounter (Signed)
Called and spoke to Kittrell T at [facility] [phone] re: PA.  She said if outpt procedure no PA required.  Relayed to [place].

## 2013-12-23 NOTE — Telephone Encounter (Signed)
Pt calling back to check the status of this call. Please send referral over. And call patient when it is done.

## 2013-12-24 ENCOUNTER — Encounter (HOSPITAL_COMMUNITY): Payer: Self-pay | Admitting: Pharmacy Technician

## 2013-12-27 ENCOUNTER — Other Ambulatory Visit: Payer: Self-pay | Admitting: Radiology

## 2013-12-28 ENCOUNTER — Other Ambulatory Visit: Payer: Self-pay | Admitting: Neurology

## 2013-12-28 ENCOUNTER — Ambulatory Visit (HOSPITAL_COMMUNITY)
Admission: RE | Admit: 2013-12-28 | Discharge: 2013-12-28 | Disposition: A | Discharge status: Home / Self Care | Payer: Medicare Other | Source: Ambulatory Visit | Attending: Neurology | Admitting: Neurology

## 2013-12-28 ENCOUNTER — Encounter (HOSPITAL_COMMUNITY): Payer: Self-pay

## 2013-12-28 DIAGNOSIS — Z8673 Personal history of transient ischemic attack (TIA), and cerebral infarction without residual deficits: Secondary | ICD-10-CM | POA: Insufficient documentation

## 2013-12-28 DIAGNOSIS — I611 Nontraumatic intracerebral hemorrhage in hemisphere, cortical: Secondary | ICD-10-CM

## 2013-12-28 DIAGNOSIS — Z0389 Encounter for observation for other suspected diseases and conditions ruled out: Secondary | ICD-10-CM | POA: Insufficient documentation

## 2013-12-28 DIAGNOSIS — Z87891 Personal history of nicotine dependence: Secondary | ICD-10-CM | POA: Diagnosis not present

## 2013-12-28 DIAGNOSIS — F411 Generalized anxiety disorder: Secondary | ICD-10-CM | POA: Insufficient documentation

## 2013-12-28 DIAGNOSIS — I619 Nontraumatic intracerebral hemorrhage, unspecified: Secondary | ICD-10-CM | POA: Diagnosis present

## 2013-12-28 LAB — CBC WITH DIFFERENTIAL/PLATELET
Basophils Absolute: 0 10*3/uL (ref 0.0–0.1)
Basophils Relative: 0 % (ref 0–1)
EOS PCT: 1 % (ref 0–5)
Eosinophils Absolute: 0.1 10*3/uL (ref 0.0–0.7)
HEMATOCRIT: 40.6 % (ref 39.0–52.0)
HEMOGLOBIN: 14.2 g/dL (ref 13.0–17.0)
LYMPHS ABS: 1 10*3/uL (ref 0.7–4.0)
Lymphocytes Relative: 23 % (ref 12–46)
MCH: 31 pg (ref 26.0–34.0)
MCHC: 35 g/dL (ref 30.0–36.0)
MCV: 88.6 fL (ref 78.0–100.0)
MONOS PCT: 6 % (ref 3–12)
Monocytes Absolute: 0.3 10*3/uL (ref 0.1–1.0)
Neutro Abs: 3 10*3/uL (ref 1.7–7.7)
Neutrophils Relative %: 70 % (ref 43–77)
Platelets: 193 10*3/uL (ref 150–400)
RBC: 4.58 MIL/uL (ref 4.22–5.81)
RDW: 12.7 % (ref 11.5–15.5)
WBC: 4.3 10*3/uL (ref 4.0–10.5)

## 2013-12-28 LAB — BASIC METABOLIC PANEL
Anion gap: 11 (ref 5–15)
BUN: 21 mg/dL (ref 6–23)
CHLORIDE: 101 meq/L (ref 96–112)
CO2: 28 meq/L (ref 19–32)
Calcium: 9 mg/dL (ref 8.4–10.5)
Creatinine, Ser: 1.19 mg/dL (ref 0.50–1.35)
GFR calc Af Amer: 66 mL/min — ABNORMAL LOW (ref >90)
GFR calc non Af Amer: 57 mL/min — ABNORMAL LOW (ref >90)
GLUCOSE: 86 mg/dL (ref 70–99)
POTASSIUM: 4.3 meq/L (ref 3.7–5.3)
SODIUM: 140 meq/L (ref 137–147)

## 2013-12-28 LAB — PROTIME-INR
INR: 0.93 (ref 0.00–1.49)
PROTHROMBIN TIME: 12.5 s (ref 11.6–15.2)

## 2013-12-28 LAB — APTT: aPTT: 27 seconds (ref 24–37)

## 2013-12-28 MED ORDER — FENTANYL CITRATE 0.05 MG/ML IJ SOLN
INTRAMUSCULAR | Status: AC | PRN
  Administered 2013-12-28: 25 ug via INTRAVENOUS

## 2013-12-28 MED ORDER — SODIUM CHLORIDE 0.9 % IV SOLN
INTRAVENOUS | Status: AC | PRN
  Administered 2013-12-28: 75 mL/h via INTRAVENOUS

## 2013-12-28 MED ORDER — IOHEXOL 300 MG/ML  SOLN
150.0000 mL | Freq: Once | INTRAMUSCULAR | Status: AC | PRN
  Administered 2013-12-28: 80 mL via INTRAVENOUS

## 2013-12-28 MED ORDER — FENTANYL CITRATE 0.05 MG/ML IJ SOLN
INTRAMUSCULAR | Status: AC
  Filled 2013-12-28: qty 2

## 2013-12-28 MED ORDER — HEPARIN SODIUM (PORCINE) 1000 UNIT/ML IJ SOLN
INTRAMUSCULAR | Status: AC | PRN
  Administered 2013-12-28: 1000 [IU] via INTRAVENOUS

## 2013-12-28 MED ORDER — MIDAZOLAM HCL 2 MG/2ML IJ SOLN
INTRAMUSCULAR | Status: AC
  Filled 2013-12-28: qty 2

## 2013-12-28 MED ORDER — MIDAZOLAM HCL 2 MG/2ML IJ SOLN
INTRAMUSCULAR | Status: AC | PRN
  Administered 2013-12-28: 1 mg via INTRAVENOUS

## 2013-12-28 MED ORDER — SODIUM CHLORIDE 0.9 % IV SOLN
Freq: Once | INTRAVENOUS | Status: AC
  Administered 2013-12-28: 09:00:00 via INTRAVENOUS

## 2013-12-28 MED ORDER — SODIUM CHLORIDE 0.9 % IV SOLN
INTRAVENOUS | Status: AC
Start: 2013-12-28 — End: 2013-12-28

## 2013-12-28 NOTE — H&P (Signed)
Brent Webb is an 79 y.o. male.   Chief Complaint: Pt suffered CVA/intracerebral hemorrhage 09/2012 Resolved nicely per follow ups x 1 yr Noted in 11/2013 recurrence of bleed per CT and MRI Denies injury; headache or any other symptoms Now scheduled for cerebral arteriogram for evaluation--possible cerebral amyloid angiopathy  HPI: CVA 2014  Past Medical History  Diagnosis Date  . Anxiety   . Stroke 09/17/2012    Past Surgical History  Procedure Laterality Date  . Cataract extraction    . Shoulder surgery  2012, 2013  . Loop recorder implant  12-03-2013    MDT LINQ implanted by Dr Caryl Comes for cryptogenic stroke  . Tee without cardioversion N/A 12/03/2013    Procedure: TRANSESOPHAGEAL ECHOCARDIOGRAM (TEE);  Surgeon: Pixie Casino, MD;  Location: [facility] ENDOSCOPY;  Service: Cardiovascular;  Laterality: N/A;    Family History  Problem Relation Age of Onset  . Hypertension Mother   . Hypertension Father    Social History:  reports that he quit smoking about 27 years ago. He has never used smokeless tobacco. He reports that he drinks alcohol. He reports that he does not use illicit drugs.  Allergies: No Known Allergies   (Not in a hospital admission)  Results for orders placed during the hospital encounter of 12/28/13 (from the past 48 hour(s))  APTT     Status: None   Collection Time    12/28/13  8:20 AM      Result Value Ref Range   aPTT 27  24 - 37 seconds  CBC WITH DIFFERENTIAL     Status: None   Collection Time    12/28/13  8:20 AM      Result Value Ref Range   WBC 4.3  4.0 - 10.5 K/uL   RBC 4.58  4.22 - 5.81 MIL/uL   Hemoglobin 14.2  13.0 - 17.0 g/dL   HCT 40.6  39.0 - 52.0 %   MCV 88.6  78.0 - 100.0 fL   MCH 31.0  26.0 - 34.0 pg   MCHC 35.0  30.0 - 36.0 g/dL   RDW 12.7  11.5 - 15.5 %   Platelets 193  150 - 400 K/uL   Neutrophils Relative % 70  43 - 77 %   Neutro Abs 3.0  1.7 - 7.7 K/uL   Lymphocytes Relative 23  12 - 46 %   Lymphs Abs 1.0  0.7 - 4.0 K/uL   Monocytes Relative 6  3 - 12 %   Monocytes Absolute 0.3  0.1 - 1.0 K/uL   Eosinophils Relative 1  0 - 5 %   Eosinophils Absolute 0.1  0.0 - 0.7 K/uL   Basophils Relative 0  0 - 1 %   Basophils Absolute 0.0  0.0 - 0.1 K/uL  PROTIME-INR     Status: None   Collection Time    12/28/13  8:20 AM      Result Value Ref Range   Prothrombin Time 12.5  11.6 - 15.2 seconds   INR 0.93  0.00 - 1.49   No results found.  Review of Systems  Constitutional: Negative for fever and weight loss.  HENT: Negative for hearing loss.   Eyes: Negative for blurred vision, double vision and photophobia.  Respiratory: Negative for shortness of breath.   Cardiovascular: Negative for chest pain.  Gastrointestinal: Negative for nausea, vomiting and abdominal pain.  Musculoskeletal: Positive for neck pain.  Neurological: Positive for headaches. Negative for dizziness, tingling, tremors, sensory change, speech change, focal weakness,  seizures, loss of consciousness and weakness.  Psychiatric/Behavioral: Negative for substance abuse.    Blood pressure 113/68, pulse 71, temperature 98 F (36.7 C), temperature source Oral, resp. rate 20, height 5\' 9"  (1.753 m), weight 64.864 kg (143 lb), SpO2 100.00%. Physical Exam  Constitutional: He is oriented to person, place, and time. He appears well-nourished.  Cardiovascular: Normal rate and regular rhythm.   No murmur heard. Respiratory: Effort normal and breath sounds normal. He has no wheezes.  GI: Soft. There is no tenderness.  Musculoskeletal: Normal range of motion.  Neurological: He is alert and oriented to person, place, and time. No cranial nerve deficit.  Skin: Skin is warm and dry.  Psychiatric: He has a normal mood and affect. His behavior is normal. Judgment and thought content normal.     Assessment/Plan CVA 2014 with intercerebral hemorrhage Resolved over 1 yr Now with evidence of re hemorrhage on follow up scans Possible cerebral amyloid angiopathy Pt  now scheduled for cerebral arteriogram Pt aware of procedure benefits and risks and agreeable to proceed Consent signed and in chart   Deron Poole A 12/28/2013, 8:57 AM

## 2013-12-28 NOTE — Sedation Documentation (Signed)
Patient denies pain and is resting comfortably.  

## 2013-12-28 NOTE — Discharge Instructions (Signed)
Angiogram, Care After °Refer to this sheet in the next few weeks. These instructions provide you with information on caring for yourself after your procedure. Your health care provider may also give you more specific instructions. Your treatment has been planned according to current medical practices, but problems sometimes occur. Call your health care provider if you have any problems or questions after your procedure.  °WHAT TO EXPECT AFTER THE PROCEDURE °After your procedure, it is typical to have the following sensations: °· Minor discomfort or tenderness and a small bump at the catheter insertion site. The bump should usually decrease in size and tenderness within 1 to 2 weeks. °· Any bruising will usually fade within 2 to 4 weeks. °HOME CARE INSTRUCTIONS  °· You may need to keep taking blood thinners if they were prescribed for you. Only take over-the-counter or prescription medicines for pain, fever, or discomfort as directed by your health care provider. °· Do not apply powder or lotion to the site. °· Do not sit in a bathtub, swimming pool, or whirlpool for 5 to 7 days. °· You may shower 24 hours after the procedure. Remove the bandage (dressing) and gently wash the site with plain soap and water. Gently pat the site dry. °· Inspect the site at least twice daily. °· Limit your activity for the first 48 hours. Do not bend, squat, or lift anything over 20 lb (9 kg) or as directed by your health care provider. °· Do not drive home if you are discharged the day of the procedure. Have someone else drive you. Follow instructions about when you can drive or return to work. °SEEK MEDICAL CARE IF: °· You get lightheaded when standing up. °· You have drainage (other than a small amount of blood on the dressing). °· You have chills. °· You have a fever. °· You have redness, warmth, swelling, or pain at the insertion site. °SEEK IMMEDIATE MEDICAL CARE IF:  °· You develop chest pain or shortness of breath, feel faint,  or pass out. °· You have bleeding, swelling larger than a walnut, or drainage from the catheter insertion site. °· You develop pain, discoloration, coldness, or severe bruising in the leg or arm that held the catheter. °· You have heavy bleeding from the site. If this happens, hold pressure on the site and call 911. °MAKE SURE YOU: °· Understand these instructions. °· Will watch your condition. °· Will get help right away if you are not doing well or get worse. °Document Released: 12/13/2004 Document Revised: 06/01/2013 Document Reviewed: 10/19/2012 °ExitCare® Patient Information ©2015 [employer]. This information is not intended to replace advice given to you by your health care provider. Make sure you discuss any questions you have with your health care provider. ° °

## 2013-12-28 NOTE — Sedation Documentation (Signed)
MD at bedside.discuss poc with spouse

## 2013-12-28 NOTE — Sedation Documentation (Signed)
Escorted with RN to short stay room recovery review groin precautions with pt and family

## 2013-12-28 NOTE — Procedures (Signed)
S/P 4 vessel cerebral arteriogram. Rt CFA approach. Findings. 1No angiographic evidence of aneurysms,avm,DAVF ,occlusions,stenosis or  Of dissections.Marland Kitchen 2.Venous outflow WNLs

## 2014-01-03 ENCOUNTER — Ambulatory Visit (INDEPENDENT_AMBULATORY_CARE_PROVIDER_SITE_OTHER): Payer: Medicare Other | Admitting: *Deleted

## 2014-01-03 DIAGNOSIS — I6389 Other cerebral infarction: Secondary | ICD-10-CM

## 2014-01-03 DIAGNOSIS — I6350 Cerebral infarction due to unspecified occlusion or stenosis of unspecified cerebral artery: Secondary | ICD-10-CM

## 2014-01-03 LAB — MDC_IDC_ENUM_SESS_TYPE_REMOTE

## 2014-01-05 NOTE — Progress Notes (Signed)
Loop recorder 

## 2014-01-13 ENCOUNTER — Encounter: Payer: Self-pay | Admitting: Internal Medicine

## 2014-01-14 ENCOUNTER — Encounter: Payer: Self-pay | Admitting: Neurology

## 2014-01-14 ENCOUNTER — Ambulatory Visit (INDEPENDENT_AMBULATORY_CARE_PROVIDER_SITE_OTHER): Payer: Medicare Other | Admitting: Neurology

## 2014-01-14 VITALS — BP 111/62 | HR 62 | Wt 146.6 lb

## 2014-01-14 DIAGNOSIS — I6350 Cerebral infarction due to unspecified occlusion or stenosis of unspecified cerebral artery: Secondary | ICD-10-CM

## 2014-01-14 DIAGNOSIS — I611 Nontraumatic intracerebral hemorrhage in hemisphere, cortical: Secondary | ICD-10-CM

## 2014-01-14 DIAGNOSIS — I619 Nontraumatic intracerebral hemorrhage, unspecified: Secondary | ICD-10-CM

## 2014-01-14 NOTE — Progress Notes (Signed)
PATIENT: Sena Clouatre DOB: 1934/10/20  REASON FOR VISIT: Urgent  follow up for  recurrent ICH HISTORY FROM: patient  HISTORY OF PRESENT ILLNESS: He is seen urgently today following recent hospital admission for headache and recurrent intracerebral hemorrhage. Patient was admitted on 11/30/13 with a two-week history of right occipital and retro-auricular headache off and on. On exam he was found to have mild right facial weakness with some involvement of upper face as well and it was not clear whether he had Bell's palsy or a small brain stem lesion however CT scan of the head showed small right parietal subarachnoid hemorrhage and expected evolution of previous right parietal hematoma from June 2014. MRI scan surprisingly showed a subacute right posterior temporal hematoma which was adjacent to the previous old hematoma from a year ago. Repeat MRI with contrast was obtained but did not show any underlying structural lesion or mass lesion. CT angiogram revealed no evidence of aneurysms or AVM. Patient was started on prednisone and valacyclovir for presumed Bell's palsy. Lab work for [employer] disease, ANA, ESR were negative. Hypercoagulable panel labs were also negative except for minimally elevated anticardiolipin antibody. Patient's MRI had also shown a small diffusion-positive lesions in the right insula and hence it was felt that his hemorrhage may represent a hemorrhagic infarct and he underwent a loop recorder implant detector of systolic fibrillation. The patient states that his son well since discharge his right facial weakness is completely resolved. His blood pressure has been under good control. He does admit to mild short-term memory and multitasking difficulties from his initial hemorrhage in June 2014 but these do not seem to be progressive. 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  He is seen today for f/u after hospital admission on 09/17/12 with right pareital ICH he presented with sensory seizures involving left hemibody x 2. CT and MRI showed 2 separate nearby areas of 5 x 3 x3 cm and 1.3 x 1 x 1.2 cm right pareital brain hemorrhage with mild mass effect and subarachnoid blood but no hydrocephalous or midline shift. BP was not elevated. No avm/aneurysms were found on MRA. Urine drug screen was negative.He was started on keppra for seizures.he has done well and reports no physical deficits but has noted decrease multitasking, organizational skills,and mild cognitive difficulties.He had f/u MRI done on 11/27/12 showing significant resolution of hematoma without any underlying tumor or vascular lesion.he is tolerating keppra well.   UPDATE 03/18/13 (LL): [name] comes to office for [place] and seizure revisit. He is doing very well, no recurrent seizure activity. Only thing that bothers him is slight memory problems. He is very active; teaches senior aerobics. He takes no other medications other than Keppra and a multivitamin.   UPDATE 09/16/13 (LL): [name] comes to office for [place] and seizure revisit. He is doing well, very active, still leading aerobics classes and senior hikes.  No recurrent seizures.  Tolerating LVT ER 500 mg without problems.  Update 01/14/2014 : He returns today for followup of her last visit a month ago. He had diagnosed it four-vessel cerebral Surrender gram performed on 7/21 by/15 by Dr. Estanislado Pandy which I have personally reviewed and seems normal without evidence of AV malformation, aneurysms or  cortical vein thrombosis. Patient states that he has noticed that he cannot function in the same capacity as before in his aerobic classes. He has trouble with remembering the sequence of the steps and multitasking but  has managed to cut back his classes and seems to get by. He also gets tense easily particularly while driving and is not confident driving on the highway. He has transient dizziness when his getting into bed and turning and denies vertigo or nausea.  REVIEW OF SYSTEMS: Full 14 system review of systems performed and notable only for: memory loss.  ALLERGIES: No Known Allergies  HOME MEDICATIONS: Outpatient Prescriptions Prior to Visit  Medication Sig Dispense Refill  . levETIRAcetam (KEPPRA XR) 500 MG 24 hr tablet Take 1 tablet (500 mg total) by mouth daily.  90 tablet  3  . Multiple Vitamin (MULTIVITAMIN WITH MINERALS) TABS Take 1 tablet by mouth daily.      . simvastatin (ZOCOR) 10 MG tablet Take 1 tablet (10 mg total) by mouth daily at 6 PM.  30 tablet  2  . terbinafine (LAMISIL) 250 MG tablet Take 250 mg by mouth daily.        No facility-administered medications prior to visit.     PHYSICAL EXAM  Filed Vitals:   01/14/14 0950  BP: 111/62  Pulse: 62  Weight: 66.497 kg (146 lb 9.6 oz)   Body mass index is 21.64 kg/(m^2).  General: well developed, well nourished, seated, in no evident distress. Head: head normocephalic and atraumatic. Orohparynx benign  Neck: supple with no carotid or supraclavicular bruits  Cardiovascular: regular rate and rhythm, no murmurs  Musculoskeletal: no deformity  Filed Vitals:   01/14/14 0950  BP: 111/62  Pulse: 62    Neurologic Exam  Mental Status: Awake and fully alert. Oriented to place and time. Recent and remote memory diminished. Attention span, concentration and fund of knowledge appropriate. Mood and affect appropriate. Mini-Mental status exam scored 29/30 clock drawing 4/4. Geriatric depression scale 1 only. Animal naming test end. Cranial Nerves: Pupils equal, briskly reactive to light. Extraocular movements full without nystagmus. Visual fields full to confrontation. Hearing intact. Facial sensation intact.able to close eyes well and  no facial weakness. Face, tongue, palate moves normally and symmetrically.  Motor: Normal bulk and tone. Normal strength in all tested extremity muscles.  Sensory: intact to touch and pinprick all 4 extremities Coordination: Rapid alternating movements normal in all extremities. Finger-to-nose and heel-to-shin performed accurately bilaterally.  Gait and Station: Arises from chair without difficulty. Stance is normal. Gait demonstrates normal stride length and balance . Able to heel, toe and tandem walk without difficulty.  Reflexes: 1+ and symmetric.   ASSESSMENT AND PLAN 7 year Caucasian male with recurrent right pareital cortical hemorrhages x 2 in April 2014 and June 2015 of unclear etiology-cortical vein thrombosis versus amyloid angiopathy and symptomatic seizures.Transient right facial weakness now resolved ? Lower motor neuron pattern difficult to explain based on MRI findings and may represent incidental Bell's palsy or small brain stem lesion not visualized on MRI. Amyloid angiopathy has been suggested as a possible etiology for his hemorrhages but his clinical presentation and exam is not consistent with it.Cerebral catheter angiogram is unremarkable. PLAN:  I had a long discussion with the patient and his wife and personally reviewed and discussed the findings of cerebral catheter angiogram showing no significant vascular abnormality. Continue Keppra XR 500 once daily. Discuss with primary physician Dr. Tenny Craw about discontinuing Zocor. Return for followup in 3 months or  call earlier if necessary.    Antony Contras, MD  01/14/2014, 11:49 AM [facility] [address] [place], [place] [address] [phone]  Note: This document was prepared with digital dictation and possible smart phrase technology. Any transcriptional errors that result from this process are unintentional.

## 2014-01-14 NOTE — Patient Instructions (Signed)
I had a long discussion with the patient and his wife and personally reviewed and discussed the findings of cerebral catheter angiogram showing no significant vascular abnormality. Continue Keppra XR 500 once daily. Discuss with primary physician Dr. Harrington Challenger about discontinuing Zocor. Return for followup in 3 months or call earlier if necessary.

## 2014-02-01 ENCOUNTER — Ambulatory Visit (INDEPENDENT_AMBULATORY_CARE_PROVIDER_SITE_OTHER): Payer: Medicare Other | Admitting: *Deleted

## 2014-02-01 DIAGNOSIS — I6350 Cerebral infarction due to unspecified occlusion or stenosis of unspecified cerebral artery: Secondary | ICD-10-CM

## 2014-02-01 DIAGNOSIS — I6389 Other cerebral infarction: Secondary | ICD-10-CM

## 2014-02-01 LAB — MDC_IDC_ENUM_SESS_TYPE_REMOTE

## 2014-02-10 NOTE — Progress Notes (Signed)
Loop recorder 

## 2014-02-17 ENCOUNTER — Encounter: Payer: Self-pay | Admitting: Internal Medicine

## 2014-03-02 ENCOUNTER — Ambulatory Visit (INDEPENDENT_AMBULATORY_CARE_PROVIDER_SITE_OTHER): Payer: Medicare Other | Admitting: *Deleted

## 2014-03-02 DIAGNOSIS — I6389 Other cerebral infarction: Secondary | ICD-10-CM

## 2014-03-02 DIAGNOSIS — I6350 Cerebral infarction due to unspecified occlusion or stenosis of unspecified cerebral artery: Secondary | ICD-10-CM

## 2014-03-08 LAB — MDC_IDC_ENUM_SESS_TYPE_REMOTE

## 2014-03-18 NOTE — Progress Notes (Signed)
Loop recorder 

## 2014-03-25 ENCOUNTER — Encounter: Payer: Self-pay | Admitting: Internal Medicine

## 2014-03-25 ENCOUNTER — Encounter: Payer: Self-pay | Admitting: Neurology

## 2014-03-25 ENCOUNTER — Telehealth: Payer: Self-pay | Admitting: Neurology

## 2014-03-25 NOTE — Telephone Encounter (Signed)
Left message for patient regarding moving appointment time on 09/21/14 per Dr. Clydene Fake schedule, printed and mailed letter with new appointment time.

## 2014-03-28 ENCOUNTER — Encounter: Payer: Self-pay | Admitting: Internal Medicine

## 2014-04-01 ENCOUNTER — Ambulatory Visit (INDEPENDENT_AMBULATORY_CARE_PROVIDER_SITE_OTHER): Payer: Medicare Other | Admitting: *Deleted

## 2014-04-01 ENCOUNTER — Encounter: Payer: Self-pay | Admitting: Internal Medicine

## 2014-04-01 DIAGNOSIS — I638 Other cerebral infarction: Secondary | ICD-10-CM

## 2014-04-01 DIAGNOSIS — I6389 Other cerebral infarction: Secondary | ICD-10-CM

## 2014-04-01 LAB — MDC_IDC_ENUM_SESS_TYPE_REMOTE: Date Time Interrogation Session: 20151030040500

## 2014-04-07 ENCOUNTER — Ambulatory Visit (INDEPENDENT_AMBULATORY_CARE_PROVIDER_SITE_OTHER): Payer: Medicare Other | Admitting: *Deleted

## 2014-04-07 DIAGNOSIS — I638 Other cerebral infarction: Secondary | ICD-10-CM

## 2014-04-07 DIAGNOSIS — I6389 Other cerebral infarction: Secondary | ICD-10-CM

## 2014-04-07 LAB — MDC_IDC_ENUM_SESS_TYPE_INCLINIC

## 2014-04-07 NOTE — Progress Notes (Signed)
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  The patient is asymptomatic.  R-waves 0.54-0.69mV.  ROV in January with Dr. Caryl Comes.

## 2014-04-08 NOTE — Progress Notes (Signed)
Loop recorder 

## 2014-04-19 ENCOUNTER — Encounter: Payer: Self-pay | Admitting: Internal Medicine

## 2014-05-02 ENCOUNTER — Ambulatory Visit (INDEPENDENT_AMBULATORY_CARE_PROVIDER_SITE_OTHER): Payer: Medicare Other | Admitting: *Deleted

## 2014-05-02 DIAGNOSIS — I638 Other cerebral infarction: Secondary | ICD-10-CM

## 2014-05-02 DIAGNOSIS — I6389 Other cerebral infarction: Secondary | ICD-10-CM

## 2014-05-02 LAB — MDC_IDC_ENUM_SESS_TYPE_REMOTE: Date Time Interrogation Session: 20151123050500

## 2014-05-11 NOTE — Progress Notes (Signed)
Loop recorder 

## 2014-05-13 ENCOUNTER — Encounter: Payer: Self-pay | Admitting: Internal Medicine

## 2014-05-19 ENCOUNTER — Encounter (HOSPITAL_COMMUNITY): Payer: Self-pay | Admitting: Internal Medicine

## 2014-05-26 ENCOUNTER — Encounter: Payer: Self-pay | Admitting: Internal Medicine

## 2014-06-01 ENCOUNTER — Ambulatory Visit (INDEPENDENT_AMBULATORY_CARE_PROVIDER_SITE_OTHER): Payer: Medicare Other | Admitting: *Deleted

## 2014-06-01 ENCOUNTER — Encounter: Payer: Self-pay | Admitting: Internal Medicine

## 2014-06-01 DIAGNOSIS — I6389 Other cerebral infarction: Secondary | ICD-10-CM

## 2014-06-01 DIAGNOSIS — I638 Other cerebral infarction: Secondary | ICD-10-CM

## 2014-06-08 NOTE — Progress Notes (Signed)
Loop recorder 

## 2014-06-09 ENCOUNTER — Encounter: Payer: Self-pay | Admitting: Internal Medicine

## 2014-06-21 LAB — MDC_IDC_ENUM_SESS_TYPE_REMOTE: Date Time Interrogation Session: 20151223050500

## 2014-06-29 ENCOUNTER — Encounter: Payer: Self-pay | Admitting: *Deleted

## 2014-06-30 ENCOUNTER — Telehealth: Payer: Self-pay | Admitting: Neurology
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
## 2014-06-30 NOTE — Telephone Encounter (Signed)
ok 
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
## 2014-07-01 ENCOUNTER — Ambulatory Visit (INDEPENDENT_AMBULATORY_CARE_PROVIDER_SITE_OTHER): Payer: Medicare Other | Admitting: *Deleted

## 2014-07-01 DIAGNOSIS — I6389 Other cerebral infarction: Secondary | ICD-10-CM

## 2014-07-01 DIAGNOSIS — I638 Other cerebral infarction: Secondary | ICD-10-CM

## 2014-07-05 ENCOUNTER — Encounter: Payer: Self-pay | Admitting: Internal Medicine

## 2014-07-05 ENCOUNTER — Ambulatory Visit (INDEPENDENT_AMBULATORY_CARE_PROVIDER_SITE_OTHER): Payer: Medicare Other | Admitting: Internal Medicine

## 2014-07-05 VITALS — BP 114/58 | HR 65 | Ht 69.0 in | Wt 146.8 lb

## 2014-07-05 DIAGNOSIS — I639 Cerebral infarction, unspecified: Secondary | ICD-10-CM

## 2014-07-05 DIAGNOSIS — Z4509 Encounter for adjustment and management of other cardiac device: Secondary | ICD-10-CM

## 2014-07-05 LAB — MDC_IDC_ENUM_SESS_TYPE_INCLINIC

## 2014-07-05 NOTE — Progress Notes (Signed)
      Patient Care Team: Melinda Crutch, MD as PCP - General (Family Medicine)   HPI  Brent Webb is a 80 y.o. male Seen in follow-up for loop recorder implanted in the context of cryptogenic stroke. He is a prior history of intracranial hemorrhage. It had been decided prior to Loop recorder insertion that in the event that atrial arrhythmias were identified that anticoagulation would be pursued.  He is had multiple recordings are heart rate of 150. Onset has not been recorded  The patient denies chest pain, shortness of breath, nocturnal dyspnea, orthopnea or peripheral edema.  There have been no palpitations, lightheadedness or syncope.   He is currently not on any blood thinners  Evaluation  included an echocardiogram demonstrating normal left ventricular function and normal left atrial dimensions. This was June 2015.  Past Medical History  Diagnosis Date  . Anxiety   . Stroke 09/17/2012    Past Surgical History  Procedure Laterality Date  . Cataract extraction    . Shoulder surgery  2012, 2013  . Loop recorder implant  12-03-2013    MDT LINQ implanted by Dr Caryl Comes for cryptogenic stroke  . Tee without cardioversion N/A 12/03/2013    Procedure: TRANSESOPHAGEAL ECHOCARDIOGRAM (TEE);  Surgeon: Pixie Casino, MD;  Location: [facility] ENDOSCOPY;  Service: Cardiovascular;  Laterality: N/A;  . Loop recorder implant N/A 12/03/2013    Procedure: LOOP RECORDER IMPLANT;  Surgeon: Deboraha Sprang, MD;  Location: [facility] CATH LAB;  Service: Cardiovascular;  Laterality: N/A;    Current Outpatient Prescriptions  Medication Sig Dispense Refill  . levETIRAcetam (KEPPRA XR) 500 MG 24 hr tablet Take 1 tablet (500 mg total) by mouth daily. 90 tablet 3  . Multiple Vitamin (MULTIVITAMIN WITH MINERALS) TABS Take 1 tablet by mouth daily.    Marland Kitchen terbinafine (LAMISIL) 250 MG tablet Take 250 mg by mouth daily.      No current facility-administered medications for this visit.    No Known Allergies  Review of  Systems negative except from HPI and PMH  Physical Exam BP 114/58 mmHg  Pulse 65  Ht 5\' 9"  (1.753 m)  Wt 146 lb 12.8 oz (66.588 kg)  BMI 21.67 kg/m2 Well developed and well nourished in no acute distress HENT normal E scleral and icterus clear Neck Supple JVP flat; carotids brisk and full Clear to ausculation Device pocket well healed; without hematoma or erythema.  There is no tethering Regular rate and rhythm, no murmurs gallops or rub Soft with active bowel sounds No clubbing cyanosis  Edema Alert and oriented, grossly normal motor and sensory function Skin Warm and Dry  ECG was ordered today and demonstrated sinus rhythm at 65 Intervals 17/09/38 Otherwise normal  Assessment and  Plan  Loop recorder in situ  Cryptogenic stroke 2  History of intracerebral hemorrhage  Tachycardia identified on his monitor  The mechanism of his tachycardia is not clear. The heart rate is 150. This is considerably higher than one might anticipate was 80 year old even was listed and physically active. We have reprogrammed his device to a detection rate of 30s with the able to identify the trajectory of onset with the idea being if it's gradual it is likely sinus and of abrupt in its represents atrial flutter

## 2014-07-05 NOTE — Patient Instructions (Signed)
Your physician recommends that you continue on your current medications as directed. Please refer to the Current Medication list given to you today.  Your physician wants you to follow-up in: 1 year with Dr. Klein.  You will receive a reminder letter in the mail two months in advance. If you don't receive a letter, please call our office to schedule the follow-up appointment.  

## 2014-07-07 ENCOUNTER — Encounter: Payer: Self-pay | Admitting: Internal Medicine

## 2014-07-07 NOTE — Progress Notes (Signed)
Loop recorder 

## 2014-07-08 ENCOUNTER — Encounter: Payer: Self-pay | Admitting: Internal Medicine

## 2014-07-11 ENCOUNTER — Encounter: Payer: Self-pay | Admitting: Neurology

## 2014-07-11 ENCOUNTER — Ambulatory Visit (INDEPENDENT_AMBULATORY_CARE_PROVIDER_SITE_OTHER): Payer: Medicare Other | Admitting: Neurology

## 2014-07-11 VITALS — BP 118/70 | HR 74 | Ht 69.0 in | Wt 149.0 lb

## 2014-07-11 DIAGNOSIS — M542 Cervicalgia: Secondary | ICD-10-CM

## 2014-07-11 DIAGNOSIS — I639 Cerebral infarction, unspecified: Secondary | ICD-10-CM
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
## 2014-07-11 NOTE — Patient Instructions (Addendum)
I had a long d/w patient about his remote brain hemorrhage & seizures risk for recurrent stroke/TIAs, seizures, and answered questions.Continue Keppra Xr 500 mg daily for seizure prevention  .Continue f/u with Dr Caryl Comes for loop recorder. Followup in the future with me in 6 months or call earlier if necessary

## 2014-07-12 ENCOUNTER — Encounter: Payer: Self-pay | Admitting: Internal Medicine

## 2014-07-18 ENCOUNTER — Encounter: Payer: Self-pay | Admitting: Internal Medicine

## 2014-07-19 ENCOUNTER — Encounter: Payer: Self-pay | Admitting: Internal Medicine

## 2014-07-21 ENCOUNTER — Encounter: Payer: Self-pay | Admitting: Internal Medicine

## 2014-07-25 LAB — MDC_IDC_ENUM_SESS_TYPE_REMOTE: Date Time Interrogation Session: 20160201050500

## 2014-08-01 ENCOUNTER — Ambulatory Visit (INDEPENDENT_AMBULATORY_CARE_PROVIDER_SITE_OTHER): Payer: Medicare Other | Admitting: *Deleted

## 2014-08-01 DIAGNOSIS — I639 Cerebral infarction, unspecified: Secondary | ICD-10-CM

## 2014-08-02 NOTE — Progress Notes (Signed)
Loop recorder 

## 2014-08-16 LAB — MDC_IDC_ENUM_SESS_TYPE_REMOTE: Date Time Interrogation Session: 20160224050500

## 2014-08-17 ENCOUNTER — Telehealth: Payer: Self-pay | Admitting: *Deleted

## 2014-08-17 DIAGNOSIS — G40209 Localization-related (focal) (partial) symptomatic epilepsy and epileptic syndromes with complex partial seizures, not intractable, without status epilepticus: Secondary | ICD-10-CM

## 2014-08-17 MED ORDER — LEVETIRACETAM ER 500 MG PO TB24: 500.0000 mg | ORAL_TABLET | Freq: Every day | ORAL | Status: DC

## 2014-08-17 NOTE — Telephone Encounter (Signed)
Patient needs a refill on Keppra. Patient only has 4 days left, please refill. Thanks

## 2014-08-17 NOTE — Telephone Encounter (Signed)
Rx has been sent  

## 2014-08-18 ENCOUNTER — Encounter: Payer: Self-pay | Admitting: Internal Medicine

## 2014-08-24 ENCOUNTER — Encounter: Payer: Self-pay | Admitting: Internal Medicine

## 2014-08-25 ENCOUNTER — Encounter: Payer: Self-pay | Admitting: Internal Medicine

## 2014-08-25 ENCOUNTER — Telehealth: Payer: Self-pay | Admitting: Neurology

## 2014-08-25 ENCOUNTER — Ambulatory Visit (INDEPENDENT_AMBULATORY_CARE_PROVIDER_SITE_OTHER): Payer: Medicare Other | Admitting: Internal Medicine

## 2014-08-25 VITALS — BP 130/70 | HR 60 | Ht 69.0 in | Wt 147.4 lb

## 2014-08-25 DIAGNOSIS — I639 Cerebral infarction, unspecified: Secondary | ICD-10-CM | POA: Diagnosis not present

## 2014-08-25 DIAGNOSIS — Z4509 Encounter for adjustment and management of other cardiac device: Secondary | ICD-10-CM

## 2014-08-25 DIAGNOSIS — I480 Paroxysmal atrial fibrillation: Secondary | ICD-10-CM

## 2014-08-25 NOTE — Patient Instructions (Addendum)
Your physician recommends that you continue on your current medications as directed. Please refer to the Current Medication list given to you today.  We will refer you to Dr. Romeo Apple   Your physician wants you to follow-up in: 1 year with Dr. Caryl Comes.  You will receive a reminder letter in the mail two months in advance. If you don't receive a letter, please call our office to schedule the follow-up appointment.

## 2014-08-25 NOTE — Progress Notes (Signed)
      Patient Care Team: Lona Kettle, MD as PCP - General (Family Medicine)   HPI  Brent Webb is a 80 y.o. male Seen in follow-up for loop recorder implanted in the context of cryptogenic stroke. He is a prior history of intracranial hemorrhage. It had been decided prior to Loop recorder insertion that in the event that atrial arrhythmias were identified that anticoagulation would be pursued.  He is had multiple recordings are heart rate of 150. Onset has not been recorded  The patient denies chest pain, shortness of breath, nocturnal dyspnea, orthopnea or peripheral edema.  There have been no palpitations, lightheadedness or syncope.   He is currently not on any blood thinners  Evaluation  included an echocardiogram demonstrating normal left ventricular function and normal left atrial dimensions. This was June 2015.  Past Medical History  Diagnosis Date  . Anxiety   . Stroke 09/17/2012    Past Surgical History  Procedure Laterality Date  . Cataract extraction    . Shoulder surgery  2012, 2013  . Loop recorder implant  12-03-2013    MDT LINQ implanted by Dr Caryl Comes for cryptogenic stroke  . Tee without cardioversion N/A 12/03/2013    Procedure: TRANSESOPHAGEAL ECHOCARDIOGRAM (TEE);  Surgeon: Pixie Casino, MD;  Location: [facility] ENDOSCOPY;  Service: Cardiovascular;  Laterality: N/A;  . Loop recorder implant N/A 12/03/2013    Procedure: LOOP RECORDER IMPLANT;  Surgeon: Deboraha Sprang, MD;  Location: [facility] CATH LAB;  Service: Cardiovascular;  Laterality: N/A;    Current Outpatient Prescriptions  Medication Sig Dispense Refill  . levETIRAcetam (KEPPRA XR) 500 MG 24 hr tablet Take 1 tablet (500 mg total) by mouth daily. 90 tablet 1  . Multiple Vitamin (MULTIVITAMIN WITH MINERALS) TABS Take 1 tablet by mouth daily.    Marland Kitchen terbinafine (LAMISIL) 250 MG tablet Take 250 mg by mouth daily.      No current facility-administered medications for this visit.    No Known Allergies  Review of  Systems negative except from HPI and PMH  Physical Exam BP 130/70 mmHg  Pulse 60  Ht 5\' 9"  (1.753 m)  Wt 66.86 kg (147 lb 6.4 oz)  BMI 21.76 kg/m2 Well developed and well nourished in no acute distress HENT normal E scleral and icterus clear Neck Supple JVP flat; carotids brisk and full Clear to ausculation Device pocket well healed; without hematoma or erythema.  There is no tethering Regular rate and rhythm, no murmurs gallops or rub Soft with active bowel sounds No clubbing cyanosis  Edema Alert and oriented, grossly normal motor and sensory function Skin Warm and Dry  ECG was ordered today and demonstrated sinus rhythm at 65 Intervals 17/09/38 Otherwise normal  Assessment and  Plan  Loop recorder in situ  Cryptogenic stroke 2  Atrial Fibrillation  History of intracerebral hemorrhage  Tachycardia identified on his monitor  We have reviewed the presence of atrial fibrillation and I've had 2 conversations with his neurologist. He reviewed the CT scans, and there were 2 intracerebral hemorrhages in the same location. It is his impression that this likely represents a vascular abnormality and so he is loathe to begin him on an anticoagulant. However, his CHADS-VASc score is 4. His annual risks would be estimated in the 40% range. We will refer him to [facility] for consideration of left atrial occlusion  We spent more than 50% of our >40 min visit in face to face counseling regarding the above

## 2014-08-26 ENCOUNTER — Encounter: Payer: Self-pay | Admitting: Internal Medicine

## 2014-08-29 ENCOUNTER — Encounter: Payer: Self-pay | Admitting: Internal Medicine

## 2014-08-29 ENCOUNTER — Telehealth: Payer: Self-pay | Admitting: Neurology

## 2014-08-29 NOTE — Telephone Encounter (Signed)
Yes Dr Caryl Comes spoke to me and we discussed his finding of atrial fibrillation but he is not a anticoagulation candidate due to h/o brain hemorrhage x 2 hence he is referred to [facility] for watchman device which is anew procedure to decrease risk of stroke from atrial fibrillation

## 2014-08-29 NOTE — Telephone Encounter (Signed)
Spoke with patient and he states that he seen Dr Deboraha Sprang on 08/25/14 and he referred him to [facility] to Dr Romeo Apple, for a procedure. Patient could not state what this procedure is but states that he would like to know if you are on board with this and also has some questions and request that Dr Leonie Man gives him a call back in regards to this matter.

## 2014-08-29 NOTE — Telephone Encounter (Signed)
Patient is calling in regards to a procedure after his stoke suggested by Dr. Rupert Stacks at his last visit.  Please call.

## 2014-08-30 ENCOUNTER — Encounter: Payer: Self-pay | Admitting: Internal Medicine

## 2014-08-30 ENCOUNTER — Ambulatory Visit (INDEPENDENT_AMBULATORY_CARE_PROVIDER_SITE_OTHER): Payer: Medicare Other | Admitting: *Deleted

## 2014-08-30 DIAGNOSIS — I639 Cerebral infarction, unspecified: Secondary | ICD-10-CM | POA: Diagnosis not present

## 2014-08-30 NOTE — Telephone Encounter (Signed)
I spoke to the patient and answered his questions. I agree in referring him to [facility] for consideration for watchman device to treat his atrial fibrillation as he is not a long-term anticoagulation candidate given history of intracerebral hemorrhages x 2.

## 2014-08-30 NOTE — Telephone Encounter (Signed)
Patient still had questions and wanted Dr Leonie Man to call him back.

## 2014-09-01 ENCOUNTER — Telehealth: Payer: Self-pay | Admitting: Internal Medicine

## 2014-09-01 DIAGNOSIS — I999 Unspecified disorder of circulatory system: Secondary | ICD-10-CM

## 2014-09-01 DIAGNOSIS — I480 Paroxysmal atrial fibrillation: Secondary | ICD-10-CM

## 2014-09-01 NOTE — Telephone Encounter (Signed)
Informed patient that referral will be placed today. Also informed him that per Dr. 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 Patient verbalized understanding and agreeable to plan.

## 2014-09-01 NOTE — Telephone Encounter (Signed)
New message     Patient needs to know if the referral form from  Dr. Lehman Prom office ([phone] office number) been received and returned back. Please give patient a call.  Thanks

## 2014-09-02 NOTE — Progress Notes (Signed)
Loop recorder 

## 2014-09-05 ENCOUNTER — Telehealth: Payer: Self-pay | Admitting: Internal Medicine

## 2014-09-05 NOTE — Telephone Encounter (Signed)
New problem   Pt stated his referral for [facility]  to Dr. Romeo Apple hasn't been sent. Please advise pt.

## 2014-09-05 NOTE — Telephone Encounter (Signed)
Informed patient that most office were closed for Good Friday, so referral was unable to be placed. Explained that our office will work on this today. He verbalized understanding.

## 2014-09-05 NOTE — Telephone Encounter (Signed)
Brent Webb, [facility], faxed referral paperwork to Dr. Chinita Pester office.

## 2014-09-06 ENCOUNTER — Encounter: Payer: Self-pay | Admitting: Internal Medicine

## 2014-09-06 LAB — MDC_IDC_ENUM_SESS_TYPE_REMOTE: Date Time Interrogation Session: 20160321040500

## 2014-09-12 ENCOUNTER — Encounter: Payer: Self-pay | Admitting: Internal Medicine

## 2014-09-13 ENCOUNTER — Encounter: Payer: Self-pay | Admitting: Internal Medicine

## 2014-09-20 ENCOUNTER — Ambulatory Visit: Payer: Medicare Other | Admitting: Neurology

## 2014-09-20 ENCOUNTER — Encounter: Payer: Self-pay | Admitting: Internal Medicine

## 2014-09-21 ENCOUNTER — Ambulatory Visit: Payer: Medicare Other | Admitting: Neurology

## 2014-09-28 ENCOUNTER — Telehealth: Payer: Self-pay | Admitting: Neurology

## 2014-09-28 NOTE — Telephone Encounter (Signed)
error 

## 2014-09-28 NOTE — Telephone Encounter (Signed)
Patient stated after consultation with Dr. Clyda Hurdle at [facility] on 4/14 regarding watchman Device (see telephone note from 3/21) procedure has been put on a waiting list.  Also stated MCR has not approved procedure and questioning what's the next step.  Please call and advise.

## 2014-09-29 ENCOUNTER — Encounter: Payer: Self-pay | Admitting: Internal Medicine

## 2014-09-29 ENCOUNTER — Ambulatory Visit (INDEPENDENT_AMBULATORY_CARE_PROVIDER_SITE_OTHER): Payer: Medicare Other | Admitting: *Deleted

## 2014-09-29 DIAGNOSIS — I639 Cerebral infarction, unspecified: Secondary | ICD-10-CM | POA: Diagnosis not present

## 2014-09-29 NOTE — Telephone Encounter (Signed)
Thanks for letting me know!

## 2014-09-29 NOTE — Telephone Encounter (Signed)
I spoke to pt and he stated that saw Dr. Clyda Hurdle at [facility] and was given information about the Watchman device.  Then was told that [facility] was not paying for this now and he would be placed on waiting list.  (cost for this $45,000-55,000).  Just wanted to let Dr. Leonie Man know.

## 2014-09-30 NOTE — Progress Notes (Signed)
Loop recorder 

## 2014-10-04 ENCOUNTER — Encounter: Payer: Self-pay | Admitting: Internal Medicine

## 2014-10-06 ENCOUNTER — Encounter: Payer: Self-pay | Admitting: Internal Medicine

## 2014-10-06 NOTE — Telephone Encounter (Signed)
10/06/14 APPOINT; W/DR.MOUNSEY ON 09/23/14      Doralee Albino, RN      Patient saw Dr. Lehman Prom on 4/15

## 2014-10-07 ENCOUNTER — Encounter: Payer: Self-pay | Admitting: Internal Medicine

## 2014-10-10 ENCOUNTER — Encounter: Payer: Self-pay | Admitting: Internal Medicine

## 2014-10-11 ENCOUNTER — Encounter: Payer: Self-pay | Admitting: Internal Medicine

## 2014-10-12 ENCOUNTER — Encounter: Payer: Self-pay | Admitting: Internal Medicine

## 2014-10-14 LAB — CUP PACEART REMOTE DEVICE CHECK: Date Time Interrogation Session: 20160422040500

## 2014-10-28 ENCOUNTER — Encounter: Payer: Self-pay | Admitting: Internal Medicine

## 2014-10-28 ENCOUNTER — Ambulatory Visit (INDEPENDENT_AMBULATORY_CARE_PROVIDER_SITE_OTHER): Payer: Medicare Other | Admitting: *Deleted

## 2014-10-28 DIAGNOSIS — I639 Cerebral infarction, unspecified: Secondary | ICD-10-CM | POA: Diagnosis not present

## 2014-11-01 NOTE — Progress Notes (Signed)
Loop recorder 

## 2014-11-02 ENCOUNTER — Encounter: Payer: Self-pay | Admitting: Internal Medicine

## 2014-11-10 LAB — CUP PACEART REMOTE DEVICE CHECK: MDC IDC SESS DTM: 20160522040500

## 2014-11-16 ENCOUNTER — Encounter: Payer: Self-pay | Admitting: Internal Medicine

## 2014-11-27 ENCOUNTER — Encounter: Payer: Self-pay | Admitting: Internal Medicine

## 2014-11-29 ENCOUNTER — Encounter: Payer: Self-pay | Admitting: Internal Medicine

## 2014-11-29 ENCOUNTER — Ambulatory Visit (INDEPENDENT_AMBULATORY_CARE_PROVIDER_SITE_OTHER): Payer: Medicare Other | Admitting: *Deleted

## 2014-11-29 DIAGNOSIS — I639 Cerebral infarction, unspecified: Secondary | ICD-10-CM | POA: Diagnosis not present

## 2014-11-29 NOTE — Progress Notes (Signed)
Loop recorder 

## 2014-11-30 ENCOUNTER — Encounter: Payer: Self-pay | Admitting: Internal Medicine

## 2014-12-05 ENCOUNTER — Encounter: Payer: Self-pay | Admitting: Internal Medicine

## 2014-12-06 ENCOUNTER — Encounter: Payer: Self-pay | Admitting: Internal Medicine

## 2014-12-06 LAB — CUP PACEART REMOTE DEVICE CHECK: MDC IDC SESS DTM: 20160621040500

## 2014-12-22 ENCOUNTER — Encounter: Payer: Self-pay | Admitting: Internal Medicine

## 2014-12-27 ENCOUNTER — Encounter: Payer: Self-pay | Admitting: Internal Medicine

## 2014-12-29 ENCOUNTER — Ambulatory Visit (INDEPENDENT_AMBULATORY_CARE_PROVIDER_SITE_OTHER): Payer: Medicare Other | Admitting: *Deleted

## 2014-12-29 DIAGNOSIS — I639 Cerebral infarction, unspecified: Secondary | ICD-10-CM | POA: Diagnosis not present

## 2014-12-29 NOTE — Progress Notes (Signed)
Loop recorder 

## 2015-01-03 ENCOUNTER — Encounter: Payer: Self-pay | Admitting: Internal Medicine

## 2015-01-06 ENCOUNTER — Encounter: Payer: Self-pay | Admitting: Internal Medicine

## 2015-01-09 ENCOUNTER — Encounter: Payer: Self-pay | Admitting: Internal Medicine

## 2015-01-12 ENCOUNTER — Encounter: Payer: Self-pay | Admitting: Neurology

## 2015-01-12 ENCOUNTER — Ambulatory Visit (INDEPENDENT_AMBULATORY_CARE_PROVIDER_SITE_OTHER): Payer: Medicare Other | Admitting: Neurology

## 2015-01-12 VITALS — BP 116/63 | HR 69 | Ht 70.0 in | Wt 145.6 lb

## 2015-01-12 DIAGNOSIS — I639 Cerebral infarction, unspecified: Secondary | ICD-10-CM | POA: Diagnosis not present

## 2015-01-12 DIAGNOSIS — I480 Paroxysmal atrial fibrillation: Secondary | ICD-10-CM | POA: Diagnosis not present

## 2015-01-12 DIAGNOSIS — G40209 Localization-related (focal) (partial) symptomatic epilepsy and epileptic syndromes with complex partial seizures, not intractable, without status epilepticus: Secondary | ICD-10-CM

## 2015-01-12 MED ORDER — LEVETIRACETAM ER 500 MG PO TB24: 500.0000 mg | ORAL_TABLET | Freq: Every day | ORAL | Status: AC

## 2015-01-12 NOTE — Patient Instructions (Signed)
I had a long discussion the patient and wife regarding his previous multiple intracerebral hemorrhages and risk of stroke given atrial fibrillation but inability to use anticoagulation in the setting of prior intracerebral hemorrhage. Patient seems interested in the watchman device unfortunately this is not yet FDA approved and we are looking at an IRB approved protocol to start this soon and will keep him in mind. Continue Keppra for seizure prophylaxis and the current dose which he seems to be tolerating well. He was given a refill of Keppra. Return for follow-up in 6 months or call earlier if necessary.

## 2015-01-13 DIAGNOSIS — I4891 Unspecified atrial fibrillation: Secondary | ICD-10-CM | POA: Insufficient documentation
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
## 2015-01-20 LAB — CUP PACEART REMOTE DEVICE CHECK: Date Time Interrogation Session: 20160723040500

## 2015-01-23 ENCOUNTER — Encounter: Payer: Self-pay | Admitting: Internal Medicine

## 2015-01-24 ENCOUNTER — Encounter: Payer: Self-pay | Admitting: Internal Medicine

## 2015-01-25 ENCOUNTER — Encounter: Payer: Self-pay | Admitting: Internal Medicine

## 2015-01-27 ENCOUNTER — Ambulatory Visit (INDEPENDENT_AMBULATORY_CARE_PROVIDER_SITE_OTHER): Payer: Medicare Other | Admitting: *Deleted

## 2015-01-27 DIAGNOSIS — I639 Cerebral infarction, unspecified: Secondary | ICD-10-CM

## 2015-02-01 ENCOUNTER — Emergency Department (HOSPITAL_COMMUNITY)
Admission: EM | Admit: 2015-02-01 | Discharge: 2015-02-01 | Disposition: A | Discharge status: Home / Self Care | Payer: Medicare Other | Attending: Emergency Medicine | Admitting: Emergency Medicine

## 2015-02-01 ENCOUNTER — Emergency Department (HOSPITAL_COMMUNITY): Payer: Medicare Other

## 2015-02-01 ENCOUNTER — Telehealth: Payer: Self-pay | Admitting: Neurology

## 2015-02-01 ENCOUNTER — Encounter (HOSPITAL_COMMUNITY): Payer: Self-pay | Admitting: Physical Medicine and Rehabilitation

## 2015-02-01 DIAGNOSIS — R51 Headache: Secondary | ICD-10-CM | POA: Insufficient documentation

## 2015-02-01 DIAGNOSIS — F419 Anxiety disorder, unspecified: Secondary | ICD-10-CM | POA: Insufficient documentation

## 2015-02-01 DIAGNOSIS — Z8673 Personal history of transient ischemic attack (TIA), and cerebral infarction without residual deficits: Secondary | ICD-10-CM | POA: Diagnosis not present

## 2015-02-01 DIAGNOSIS — Z79899 Other long term (current) drug therapy: Secondary | ICD-10-CM | POA: Diagnosis not present

## 2015-02-01 DIAGNOSIS — Z87891 Personal history of nicotine dependence: Secondary | ICD-10-CM | POA: Insufficient documentation

## 2015-02-01 DIAGNOSIS — R519 Headache, unspecified: Secondary | ICD-10-CM

## 2015-02-01 LAB — COMPREHENSIVE METABOLIC PANEL
ALK PHOS: 51 U/L (ref 38–126)
ALT: 16 U/L — AB (ref 17–63)
AST: 23 U/L (ref 15–41)
Albumin: 3.6 g/dL (ref 3.5–5.0)
Anion gap: 7 (ref 5–15)
BILIRUBIN TOTAL: 0.9 mg/dL (ref 0.3–1.2)
BUN: 17 mg/dL (ref 6–20)
CALCIUM: 9.2 mg/dL (ref 8.9–10.3)
CHLORIDE: 104 mmol/L (ref 101–111)
CO2: 28 mmol/L (ref 22–32)
CREATININE: 1.3 mg/dL — AB (ref 0.61–1.24)
GFR, EST AFRICAN AMERICAN: 59 mL/min — AB (ref >60)
GFR, EST NON AFRICAN AMERICAN: 51 mL/min — AB (ref >60)
Glucose, Bld: 88 mg/dL (ref 65–99)
Potassium: 4.8 mmol/L (ref 3.5–5.1)
Sodium: 139 mmol/L (ref 135–145)
Total Protein: 6.3 g/dL — ABNORMAL LOW (ref 6.5–8.1)

## 2015-02-01 LAB — DIFFERENTIAL
BASOS ABS: 0 10*3/uL (ref 0.0–0.1)
BASOS PCT: 0 % (ref 0–1)
Eosinophils Absolute: 0.1 10*3/uL (ref 0.0–0.7)
Eosinophils Relative: 1 % (ref 0–5)
LYMPHS ABS: 1 10*3/uL (ref 0.7–4.0)
LYMPHS PCT: 18 % (ref 12–46)
MONO ABS: 0.4 10*3/uL (ref 0.1–1.0)
MONOS PCT: 7 % (ref 3–12)
NEUTROS ABS: 4.3 10*3/uL (ref 1.7–7.7)
Neutrophils Relative %: 74 % (ref 43–77)

## 2015-02-01 LAB — CBC
HEMATOCRIT: 40.3 % (ref 39.0–52.0)
HEMOGLOBIN: 14 g/dL (ref 13.0–17.0)
MCH: 31.2 pg (ref 26.0–34.0)
MCHC: 34.7 g/dL (ref 30.0–36.0)
MCV: 89.8 fL (ref 78.0–100.0)
Platelets: 218 10*3/uL (ref 150–400)
RBC: 4.49 MIL/uL (ref 4.22–5.81)
RDW: 12.5 % (ref 11.5–15.5)
WBC: 5.8 10*3/uL (ref 4.0–10.5)

## 2015-02-01 LAB — I-STAT TROPONIN, ED: TROPONIN I, POC: 0 ng/mL (ref 0.00–0.08)

## 2015-02-01 LAB — APTT: APTT: 28 s (ref 24–37)

## 2015-02-01 LAB — PROTIME-INR
INR: 0.98 (ref 0.00–1.49)
Prothrombin Time: 13.2 seconds (ref 11.6–15.2)

## 2015-02-01 MED ORDER — ACETAMINOPHEN 325 MG PO TABS
650.0000 mg | ORAL_TABLET | Freq: Once | ORAL | Status: AC
  Administered 2015-02-01: 650 mg via ORAL
  Filled 2015-02-01: qty 2

## 2015-02-01 NOTE — Telephone Encounter (Signed)
Spoke with patient he informed me that he hit his head and is now experiencing these pressure across his head, specifically when he bends over; Dr. Erlinda Hong suggested that he visit the ED to rule our SDH.  Pt verbalized understanding.

## 2015-02-01 NOTE — Discharge Instructions (Signed)
-   Rest and OTC tylenol for headache relief - Return to ED with fevers, unrelenting headache despite medicine, vision changes, neck pain, passing out, weakness, unsteady gait or further worsening of symptoms

## 2015-02-01 NOTE — Telephone Encounter (Signed)
Patient called and is experiencing pressure in temple across forehead above eyebrows when bending over ie to tie shoes. No dizziness or light headedness. Dr Leonie Man has advised him to be aware of these signs. Please call and advise. Patient can be reached at [phone].

## 2015-02-01 NOTE — ED Notes (Signed)
Pt presents to department for evaluation of headache and "pressure" sensation to entire head. Symptoms started on Sunday after hitting head on car door. Reports CVA in past, started with similar symptoms. Pt is alert and oriented x4 upon arrival. No neurological deficits noted at present.

## 2015-02-01 NOTE — Progress Notes (Signed)
Loop recorder 

## 2015-02-01 NOTE — ED Provider Notes (Signed)
CSN: [id number]     Arrival date & time 02/01/15  1223 History   First MD Initiated Contact with Patient 02/01/15 1404     Chief Complaint  Patient presents with  . Headache    HPI  Brent Webb is a 80 year old male with PMHx of stroke, seizures and anxiety presenting with a 1 day history of headache. He hit his head on his car door Sunday and was asymptomatic at the time. No LOC. He developed a throbbing and "pressure-like" headache yesterday afternoon. The headache was improving until he worked out this morning which caused it to increase in severity. Denies vision changes, slurred speech, extremity weakness, confusion or disorientation. Pt has had two strokes in the past and he reports that his stroke symptoms felt similar to this "pressure headache". Reports he called his neurologist this morning about the headache and he told him to come get checked out in the ED. He is very anxious that he is having another stroke. Denies chest pain, SOB, abdominal pain, nausea or vomiting.    Past Medical History  Diagnosis Date  . Anxiety   . Stroke 09/17/2012   Past Surgical History  Procedure Laterality Date  . Cataract extraction    . Shoulder surgery  2012, 2013  . Loop recorder implant  12-03-2013    MDT LINQ implanted by Dr Caryl Comes for cryptogenic stroke  . Tee without cardioversion N/A 12/03/2013    Procedure: TRANSESOPHAGEAL ECHOCARDIOGRAM (TEE);  Surgeon: Pixie Casino, MD;  Location[facility] ENDOSCOPY;  Service: Cardiovascular;  Laterality: N/A;  . Loop recorder implant N/A 12/03/2013    Procedure: LOOP RECORDER IMPLANT;  Surgeon: Deboraha Sprang, MD;  Location: [facility] CATH LAB;  Service: Cardiovascular;  Laterality: N/A;   Family History  Problem Relation Age of Onset  . Hypertension Mother   . Hypertension Father    Social History  Substance Use Topics  . Smoking status: Former Smoker    Quit date: 02/08/1986  . Smokeless tobacco: Never Used  . Alcohol Use: 0.0 oz/week    0 Standard drinks  or equivalent per week     Comment: occasionally    Review of Systems  Constitutional: Negative for fever and chills.  Eyes: Negative for visual disturbance.  Respiratory: Negative for shortness of breath.   Cardiovascular: Negative for chest pain.  Gastrointestinal: Negative for nausea, vomiting and abdominal pain.  Musculoskeletal: Negative for gait problem and neck pain.  Skin: Negative for wound.  Neurological: Positive for headaches. Negative for dizziness, seizures, syncope, speech difficulty, weakness and light-headedness.      Allergies  Review of patient's allergies indicates no known allergies.  Home Medications   Prior to Admission medications   Medication Sig Start Date End Date Taking? Authorizing Provider  acetaminophen (TYLENOL) 500 MG tablet Take 500 mg by mouth.    Historical Provider, MD  levETIRAcetam (KEPPRA XR) 500 MG 24 hr tablet Take 1 tablet (500 mg total) by mouth daily. 01/12/15   Garvin Fila, MD  Multiple Vitamin (MULTIVITAMIN WITH MINERALS) TABS Take 1 tablet by mouth daily.    Historical Provider, MD  terbinafine (LAMISIL) 250 MG tablet Take 250 mg by mouth daily.  08/09/13   Historical Provider, MD   BP 98/42 mmHg  Pulse 55  Temp(Src) 97.5 F (36.4 C) (Oral)  Resp 16  Ht 5\' 9"  (1.753 m)  Wt 145 lb 6.4 oz (65.953 kg)  BMI 21.46 kg/m2  SpO2 100% Physical Exam  Constitutional: He is oriented  to person, place, and time. He appears well-developed and well-nourished. No distress.  HENT:  Head: Normocephalic and atraumatic.  Eyes: Conjunctivae and EOM are normal. Pupils are equal, round, and reactive to light.  Neck: Normal range of motion.  Cardiovascular: Normal rate, regular rhythm and normal heart sounds.   Pulmonary/Chest: Effort normal and breath sounds normal. No respiratory distress.  Abdominal: Soft. There is no tenderness.  Neurological: He is alert and oriented to person, place, and time. He has normal strength. No cranial nerve deficit  or sensory deficit.  No residual deficits from previous strokes. No speech slur.   Skin: Skin is warm and dry.  Psychiatric: He has a normal mood and affect. His behavior is normal.  Vitals reviewed.   ED Course  Procedures (including critical care time) Labs Review Labs Reviewed  COMPREHENSIVE METABOLIC PANEL - Abnormal; Notable for the following:    Creatinine, Ser 1.30 (*)    Total Protein 6.3 (*)    ALT 16 (*)    GFR calc non Af Amer 51 (*)    GFR calc Af Amer 59 (*)    All other components within normal limits  PROTIME-INR  APTT  CBC  DIFFERENTIAL  I-STAT TROPOININ, ED    Imaging Review Ct Head Wo Contrast  02/01/2015   CLINICAL DATA:  Posttraumatic headache after falling and hitting head on car door. No reported loss of consciousness.  EXAM: CT HEAD WITHOUT CONTRAST  TECHNIQUE: Contiguous axial images were obtained from the base of the skull through the vertex without intravenous contrast.  COMPARISON:  CT scan of December 02, 2013.  FINDINGS: Bony calvarium is intact. Stable old right posterior parietal infarction is noted. No mass effect or midline shift is noted. Ventricular size is within normal limits. There is no evidence of mass lesion, hemorrhage or acute infarction.  IMPRESSION: Stable old right parietal infarction. No acute intracranial abnormality seen.   Electronically Signed   By: Marijo Conception, M.D.   On: 02/01/2015 13:23   I have personally reviewed and evaluated these images and lab results as part of my medical decision-making.   MDM   Final diagnoses:  Headache, unspecified headache type   1. Headache Pt presenting with throbbing "pressure" headache x 1 day. He hit his head on a car door Sunday with no LOC. Pt is very anxious because his past stroke symptoms presenting with a "pressure" headache. VSS. No neuro deficits on exam. Head CT shows stable old infarct and no acute abnormality. Pt is visibly relieved by CT results. Requesting tylenol and discharge.  Instructed pt to rest and use tylenol for headache. Return to ED with syncope, lightheadedness, disorientation, vision changes, weakness, intense nausea/vomiting or further worsening of symptoms. Pt agrees with plan.     Josephina Gip, PA-C 02/01/15 [place], MD 02/01/15 [phone]

## 2015-02-02 ENCOUNTER — Telehealth: Payer: Self-pay | Admitting: Neurology

## 2015-02-02 NOTE — Telephone Encounter (Signed)
Patient called stating [facility] had called him to let him know the Watchman procedure had been approved and he is back in the loop to have procedure. He wanted to let Dr Leonie Man know. Patient can be reached at [phone] and [phone].

## 2015-02-03 ENCOUNTER — Telehealth: Payer: Self-pay | Admitting: Internal Medicine

## 2015-02-03 NOTE — Telephone Encounter (Signed)
Pt requesting to discuss with Dr Caryl Comes whether or not  Dr Caryl Comes feels device placement is still  appropriate for pt to have done.  Pt advised I will forward to Dr Caryl Comes, pt aware Dr Caryl Comes out of office this week.

## 2015-02-03 NOTE — Telephone Encounter (Signed)
Pt states he saw Dr Lehman Prom in April 2016, watchman device was recommended but not approved by Medicare at the time, pt was placed on waiting list for device.  Pt received a call from Dr Sullivan Lone office yesterday, watchman device approved by Medicare now, pt scheduled for procedure 05/10/15.

## 2015-02-03 NOTE — Telephone Encounter (Signed)
New message    Patient calling seen Dr.Klein in April it was suggestion for patient to have a watchman procedure- with a MD at [facility].  Patient went to see the MD in [facility]. The procedure has been approve by Medicare. Pending date is schedule Nov.30.2016.   Patient has 2 questions.   1. Is patient still a candidate for this procedure.    2. Health wise, age

## 2015-02-03 NOTE — Telephone Encounter (Signed)
Thanks and good luck with it

## 2015-02-05 NOTE — Telephone Encounter (Signed)
Yes  Would extimate his annual risk of stroke at 4-6%  And this should be signficantly reduced  He would require periprocedural anticoagulation for 45 days   What has he discussed with neurology about this

## 2015-02-06 NOTE — Telephone Encounter (Signed)
Pt aware of Dr Olin Pia recommendations and comments.  Pt states he has called Dr Clydene Fake office but has not heard back from him yet.

## 2015-02-07 NOTE — Telephone Encounter (Signed)
error 

## 2015-02-27 ENCOUNTER — Encounter: Payer: Self-pay | Admitting: Internal Medicine

## 2015-02-27 ENCOUNTER — Ambulatory Visit (INDEPENDENT_AMBULATORY_CARE_PROVIDER_SITE_OTHER): Payer: Medicare Other | Admitting: *Deleted

## 2015-02-27 DIAGNOSIS — I639 Cerebral infarction, unspecified: Secondary | ICD-10-CM

## 2015-02-27 LAB — CUP PACEART REMOTE DEVICE CHECK: MDC IDC SESS DTM: 20160919040611

## 2015-02-28 ENCOUNTER — Encounter: Payer: Self-pay | Admitting: Internal Medicine

## 2015-03-01 ENCOUNTER — Encounter: Payer: Self-pay | Admitting: Internal Medicine

## 2015-03-01 NOTE — Progress Notes (Signed)
Loop recorder 

## 2015-03-06 NOTE — Progress Notes (Addendum)
Carelink summary report received. Battery status OK. Normal device function. No new symptom, brady, or pause episodes. 18 tachy episodes--SVT, rates 130s-140s. Hx AF, no A/C due to hx of intracerebral hemorrhage per Dr. Clydene Fake note from 01/12/15. Monthly summary reports and ROV with SK in 06/2015.

## 2015-03-23 ENCOUNTER — Ambulatory Visit: Payer: Medicare Other | Attending: Family Medicine | Admitting: Physical Therapy

## 2015-03-23 DIAGNOSIS — R1032 Left lower quadrant pain: Secondary | ICD-10-CM | POA: Diagnosis present

## 2015-03-23 DIAGNOSIS — M6289 Other specified disorders of muscle: Secondary | ICD-10-CM | POA: Diagnosis present

## 2015-03-23 DIAGNOSIS — R29898 Other symptoms and signs involving the musculoskeletal system: Secondary | ICD-10-CM

## 2015-03-23 NOTE — Patient Instructions (Signed)
Hip Flexion / Knee Extension: Straight-Leg Raise (Eccentric)   Lie on back. Lift leg with knee straight. Slowly lower leg for 3-5 seconds. ___ reps per set, ___ sets per day, ___ days per week. Lower like elevator, stopping at each floor. Add ___ lbs when you achieve ___ repetitions. Rest on elbows. Rest on straight arms.  ABDUCTION: Side-Lying (Active)   Lie on left side, top leg straight. Raise top leg as far as possible. Use ___ lbs. Complete ___ sets of ___ repetitions. Perform ___ sessions per day.  http://gtsc.exer.[email or web address]   (Home) Extension: Hip   With support under abdomen, tighten stomach. Lift right leg in line with body. Do not hyperextend. Alternate legs. Repeat ____ times per set. Do ____ sets per session. Do ____ sessions per week.  Abduction: Clam (Eccentric) - Side-Lying   Lie on side with knees bent. Lift top knee, keeping feet together. Keep trunk steady. Slowly lower for 3-5 seconds. ___ reps per set, ___ sets per day, ___ days per week. Add ___ lbs when you achieve ___ repetitions.    Bridge   Lie back, legs bent. Inhale, pressing hips up. Keeping ribs in, lengthen lower back. Exhale, rolling down along spine from top. Repeat ____ times. Do ____ sessions per day.

## 2015-03-23 NOTE — Therapy (Signed)
[facility] [address], [place], [place], [address] Phone: [phone]   Fax:  [phone]  Physical Therapy Evaluation  Patient Details  Name: Brent Webb MRN: [id number] Date of Birth: 06/07/1935 Referring Provider:  Lona Kettle, MD  Encounter Date: 03/23/2015      PT End of Session - 03/23/15 0900    Visit Number 1   PT Start Time 0835   PT Stop Time 0902   PT Time Calculation (min) 27 min   Activity Tolerance Patient tolerated treatment well   Behavior During Therapy [facility] for tasks assessed/performed      Past Medical History  Diagnosis Date  . Anxiety   . Stroke [facility]) 09/17/2012    Past Surgical History  Procedure Laterality Date  . Cataract extraction    . Shoulder surgery  2012, 2013  . Loop recorder implant  12-03-2013    MDT LINQ implanted by Dr Caryl Comes for cryptogenic stroke  . Tee without cardioversion N/A 12/03/2013    Procedure: TRANSESOPHAGEAL ECHOCARDIOGRAM (TEE);  Surgeon: Pixie Casino, MD;  Location: [facility] ENDOSCOPY;  Service: Cardiovascular;  Laterality: N/A;  . Loop recorder implant N/A 12/03/2013    Procedure: LOOP RECORDER IMPLANT;  Surgeon: Deboraha Sprang, MD;  Location: [facility] CATH LAB;  Service: Cardiovascular;  Laterality: N/A;    There were no vitals filed for this visit.  Visit Diagnosis:  Weakness of both hips - Plan: PT plan of care cert/re-cert  Groin pain, left - Plan: PT plan of care cert/re-cert      Subjective Assessment - 03/23/15 0838    Subjective Pt has been having left groin pain for 3 months.  Pt does recall a slip where he had to catch himself and feels he may have pulled a muscle.  Pain has been intermittent since that time. Pain is worse in morning or if he doesn't stretch before working out. 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   Patient Stated Goals decrease pain   Currently in Pain? No/denies            [facility] PT Assessment -  03/23/15 0001    Assessment   Medical Diagnosis groin pain   Onset Date/Surgical Date 12/21/14   Next MD Visit can't recall   Precautions   Precautions None   Restrictions   Weight Bearing Restrictions No   Balance Screen   Has the patient fallen in the past 6 months No   Prior Function   Level of Independence Independent   Cognition   Overall Cognitive Status Within Functional Limits for tasks assessed   Observation/Other Assessments   Focus on Therapeutic Outcomes (FOTO)  17% limited   Sensation   Light Touch Appears Intact   ROM / Strength   AROM / PROM / Strength AROM;Strength   AROM   Overall AROM Comments hip WFL all directions   AROM Assessment Site Hip   Right/Left Hip Left   Strength   Strength Assessment Site Hip   Right/Left Hip Left   Left Hip Flexion 3/5   Left Hip Extension 3-/5   Left Hip ABduction 3/5   Palpation   Palpation comment trigger point noted L proximal adductors, no tenderness   Special Tests    Special Tests Lumbar   Lumbar Tests FABER test   FABER test   findings Negative  PT Education - 2015-04-11 0900    Education provided Yes   Education Details HEP, PT POC   Person(s) Educated Patient   Methods Explanation;Demonstration;Handout   Comprehension Returned demonstration;Verbalized understanding          PT Short Term Goals - 2015/04/11 0904    PT SHORT TERM GOAL #1   Title Pt indedpendent with HEP   Time 1   Period Days   Status Achieved                  Plan - Apr 11, 2015 0903    Clinical Impression Statement Pt is a very active 80 y/o male with onset of groin pain 3 months ago.  Pt educated on and return demonstrated HEP for hip strengthening.  Pt with no further PT needs at this time.   PT Frequency One time visit   PT Home Exercise Plan SLR, bridge, clam   Consulted and Agree with Plan of Care Patient          G-Codes - 04-11-15 0906    Functional Assessment Tool  Used FOTO   Functional Limitation Mobility: Walking and moving around   Mobility: Walking and Moving Around Current Status [phone]) At least 1 percent but less than 20 percent impaired, limited or restricted   Mobility: Walking and Moving Around Goal Status [phone]) At least 1 percent but less than 20 percent impaired, limited or restricted   Mobility: Walking and Moving Around Discharge Status [phone]) At least 1 percent but less than 20 percent impaired, limited or restricted       Problem List Patient Active Problem List   Diagnosis Date Noted  . Atrial fibrillation ([place]) 01/13/2015  . Other and unspecified hyperlipidemia 12/03/2013  . Ptosis, R eye 12/03/2013  . possible Right-sided Bell's palsy 12/03/2013  . SAH (subarachnoid hemorrhage) ([place]) 12/02/2013  . right opercular cerebral ischemic infarct 12/02/2013  . Stroke due to intracerebral hemorrhage ([place]) 11/30/2013  . Intracerebral hemorrhage ([place]) 12/16/2012  . 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 [place], [place], [address] Phone: [phone]   Fax:  [phone]

## 2015-03-27 LAB — CUP PACEART REMOTE DEVICE CHECK: Date Time Interrogation Session: 20160820033736

## 2015-03-27 NOTE — Progress Notes (Signed)
Carelink summary report received. Battery status OK. Normal device function. No new symptom, brady, or pause episodes. 49 tachy episodes--SVT, rates 130s-140s. Hx AF, no A/C due to hx of intracerebral hemorrhage per Dr. Clydene Fake note from 01/12/15. Monthly summary reports and ROV with SK in 06/2015.

## 2015-03-28 ENCOUNTER — Ambulatory Visit (INDEPENDENT_AMBULATORY_CARE_PROVIDER_SITE_OTHER): Payer: Medicare Other | Admitting: *Deleted

## 2015-03-28 DIAGNOSIS — I639 Cerebral infarction, unspecified: Secondary | ICD-10-CM

## 2015-03-30 NOTE — Progress Notes (Signed)
Loop recorder 

## 2015-04-18 LAB — CUP PACEART REMOTE DEVICE CHECK: MDC IDC SESS DTM: 20161019043623

## 2015-04-18 NOTE — Progress Notes (Signed)
Carelink summary report received. Battery status OK. Normal device function. No new symptom, brady, or pause episodes. 66 tachy episodes--all AF, no A/C due to hx of multiple intracerebral hemorrhages. Monthly summary reports and ROV with SK in 06/2015.

## 2015-04-27 ENCOUNTER — Ambulatory Visit (INDEPENDENT_AMBULATORY_CARE_PROVIDER_SITE_OTHER): Payer: Medicare Other | Admitting: *Deleted

## 2015-04-27 DIAGNOSIS — I639 Cerebral infarction, unspecified: Secondary | ICD-10-CM | POA: Diagnosis not present

## 2015-04-27 LAB — CUP PACEART REMOTE DEVICE CHECK: MDC IDC SESS DTM: 20161118050611

## 2015-04-28 NOTE — Progress Notes (Signed)
Carelink Summary Report / Loop Recorder 

## 2015-05-01 ENCOUNTER — Telehealth: Payer: Self-pay | Admitting: Internal Medicine

## 2015-05-01 NOTE — Telephone Encounter (Signed)
Walk in pt form-[facility] procedure has questions- gave to [facility]

## 2015-05-01 NOTE — Telephone Encounter (Signed)
Will forward to Dr. Klein to review. 

## 2015-05-01 NOTE — Telephone Encounter (Signed)
New Message  Pt called states that he is scheduled for a procedure on 06/09/2015 watchman proceedure. Pt states that his forms indicate that he will need an INR level coumadin appt 3-5 days after leaving the hospital. Pt requests a call back to discuss if this is really needed and what is it for being that he has never taken coumadin. Please assist

## 2015-05-02 NOTE — Telephone Encounter (Signed)
Dr. Caryl Comes spoke with the patient last night. Confirmed Watchman procedure 05/10/15 at [facility]. Staff message was forwarded to [facility] for CVRR visit.  I spoke with Gay Filler this afternoon and she states she has tried to call the patient to schedule.  CVRR appt 12/5.

## 2015-05-12 ENCOUNTER — Telehealth: Payer: Self-pay | Admitting: Internal Medicine

## 2015-05-12 NOTE — Telephone Encounter (Signed)
I called and spoke with the patient. He states he had his Watchman done at [facility] with Dr. Lehman Prom and that things went well. He is scheduled for CVRR follow up on 05/15/15. He was wanting to make sure Dr. Caryl Comes was "in the loop." I inquired if he would like me to have Dr. Caryl Comes call him next week. He states that should not be necessary at this time as I advised him we usually receive Dr. Chinita Pester records at some point. The patient is agreeable with this. He is scheduled to have a repeat echo and follow up with Dr. Lehman Prom around 06/30/15.

## 2015-05-12 NOTE — Telephone Encounter (Signed)
New message     Pt had watchman procedure at [facility] recently.  He is due to have a new coumadin appt Monday.  He has some post procedure questions to ask.  Please call

## 2015-05-15 ENCOUNTER — Ambulatory Visit (INDEPENDENT_AMBULATORY_CARE_PROVIDER_SITE_OTHER): Payer: Medicare Other | Admitting: Pharmacist

## 2015-05-15 DIAGNOSIS — I480 Paroxysmal atrial fibrillation: Secondary | ICD-10-CM | POA: Diagnosis not present

## 2015-05-15 DIAGNOSIS — Z5181 Encounter for therapeutic drug level monitoring: Secondary | ICD-10-CM | POA: Diagnosis not present

## 2015-05-15 LAB — POCT INR: INR: 2.2

## 2015-05-15 NOTE — Progress Notes (Signed)
A full discussion of the nature of anticoagulants has been carried out.  A benefit risk analysis has been presented to the patient, so that they understand the justification for choosing anticoagulation at this time. The need for frequent and regular monitoring, precise dosage adjustment and compliance is stressed.  Side effects of potential bleeding are discussed.  The patient should avoid any OTC items containing aspirin or ibuprofen, and should avoid great swings in general diet.  Avoid alcohol consumption.  Call if any signs of abnormal bleeding.  Next PT/INR test in 1 week 

## 2015-05-22 ENCOUNTER — Ambulatory Visit (INDEPENDENT_AMBULATORY_CARE_PROVIDER_SITE_OTHER): Payer: Medicare Other | Admitting: Pharmacist

## 2015-05-22 DIAGNOSIS — I480 Paroxysmal atrial fibrillation: Secondary | ICD-10-CM | POA: Diagnosis not present

## 2015-05-22 DIAGNOSIS — Z5181 Encounter for therapeutic drug level monitoring: Secondary | ICD-10-CM | POA: Diagnosis not present

## 2015-05-22 LAB — POCT INR: INR: 3.2

## 2015-05-28 ENCOUNTER — Encounter (HOSPITAL_COMMUNITY): Payer: Self-pay

## 2015-05-28 ENCOUNTER — Inpatient Hospital Stay (HOSPITAL_COMMUNITY): Payer: Medicare Other

## 2015-05-28 ENCOUNTER — Emergency Department (HOSPITAL_COMMUNITY): Payer: Medicare Other

## 2015-05-28 ENCOUNTER — Telehealth: Payer: Self-pay | Admitting: Neurology

## 2015-05-28 ENCOUNTER — Inpatient Hospital Stay (HOSPITAL_COMMUNITY)
Admission: EM | Admit: 2015-05-28 | Discharge: 2015-06-11 | DRG: 064 | Disposition: E | Payer: Medicare Other | Attending: Neurology | Admitting: Neurology

## 2015-05-28 DIAGNOSIS — I4891 Unspecified atrial fibrillation: Secondary | ICD-10-CM | POA: Insufficient documentation

## 2015-05-28 DIAGNOSIS — G936 Cerebral edema: Secondary | ICD-10-CM | POA: Diagnosis present

## 2015-05-28 DIAGNOSIS — J9600 Acute respiratory failure, unspecified whether with hypoxia or hypercapnia: Secondary | ICD-10-CM | POA: Diagnosis present

## 2015-05-28 DIAGNOSIS — G935 Compression of brain: Secondary | ICD-10-CM | POA: Diagnosis present

## 2015-05-28 DIAGNOSIS — G8194 Hemiplegia, unspecified affecting left nondominant side: Secondary | ICD-10-CM | POA: Diagnosis present

## 2015-05-28 DIAGNOSIS — R4020 Unspecified coma: Secondary | ICD-10-CM | POA: Diagnosis present

## 2015-05-28 DIAGNOSIS — E859 Amyloidosis, unspecified: Secondary | ICD-10-CM | POA: Diagnosis present

## 2015-05-28 DIAGNOSIS — T45515A Adverse effect of anticoagulants, initial encounter: Secondary | ICD-10-CM | POA: Diagnosis present

## 2015-05-28 DIAGNOSIS — I10 Essential (primary) hypertension: Secondary | ICD-10-CM | POA: Diagnosis present

## 2015-05-28 DIAGNOSIS — I639 Cerebral infarction, unspecified: Secondary | ICD-10-CM | POA: Diagnosis not present

## 2015-05-28 DIAGNOSIS — J9690 Respiratory failure, unspecified, unspecified whether with hypoxia or hypercapnia: Secondary | ICD-10-CM | POA: Insufficient documentation

## 2015-05-28 DIAGNOSIS — R791 Abnormal coagulation profile: Secondary | ICD-10-CM | POA: Diagnosis present

## 2015-05-28 DIAGNOSIS — Z66 Do not resuscitate: Secondary | ICD-10-CM | POA: Diagnosis not present

## 2015-05-28 DIAGNOSIS — Z8673 Personal history of transient ischemic attack (TIA), and cerebral infarction without residual deficits: Secondary | ICD-10-CM | POA: Diagnosis not present

## 2015-05-28 DIAGNOSIS — R471 Dysarthria and anarthria: Secondary | ICD-10-CM | POA: Diagnosis present

## 2015-05-28 DIAGNOSIS — R29708 NIHSS score 8: Secondary | ICD-10-CM | POA: Diagnosis present

## 2015-05-28 DIAGNOSIS — R29810 Facial weakness: Secondary | ICD-10-CM | POA: Diagnosis present

## 2015-05-28 DIAGNOSIS — R51 Headache: Secondary | ICD-10-CM | POA: Diagnosis present

## 2015-05-28 DIAGNOSIS — Z515 Encounter for palliative care: Secondary | ICD-10-CM | POA: Diagnosis not present

## 2015-05-28 DIAGNOSIS — G9382 Brain death: Secondary | ICD-10-CM | POA: Diagnosis present

## 2015-05-28 DIAGNOSIS — R339 Retention of urine, unspecified: Secondary | ICD-10-CM | POA: Diagnosis not present

## 2015-05-28 DIAGNOSIS — I619 Nontraumatic intracerebral hemorrhage, unspecified: Principal | ICD-10-CM | POA: Diagnosis present

## 2015-05-28 DIAGNOSIS — Z7901 Long term (current) use of anticoagulants: Secondary | ICD-10-CM | POA: Diagnosis not present

## 2015-05-28 HISTORY — DX: Unspecified atrial fibrillation: I48.91

## 2015-05-28 LAB — COMPREHENSIVE METABOLIC PANEL
ALT: 18 U/L (ref 17–63)
AST: 27 U/L (ref 15–41)
Albumin: 3.6 g/dL (ref 3.5–5.0)
Alkaline Phosphatase: 62 U/L (ref 38–126)
Anion gap: 7 (ref 5–15)
BUN: 17 mg/dL (ref 6–20)
CHLORIDE: 106 mmol/L (ref 101–111)
CO2: 26 mmol/L (ref 22–32)
CREATININE: 1.3 mg/dL — AB (ref 0.61–1.24)
Calcium: 9.5 mg/dL (ref 8.9–10.3)
GFR, EST AFRICAN AMERICAN: 59 mL/min — AB (ref >60)
GFR, EST NON AFRICAN AMERICAN: 51 mL/min — AB (ref >60)
Glucose, Bld: 99 mg/dL (ref 65–99)
POTASSIUM: 4.5 mmol/L (ref 3.5–5.1)
Sodium: 139 mmol/L (ref 135–145)
Total Bilirubin: 0.7 mg/dL (ref 0.3–1.2)
Total Protein: 6.1 g/dL — ABNORMAL LOW (ref 6.5–8.1)

## 2015-05-28 LAB — PROTIME-INR
INR: 1.92 — ABNORMAL HIGH (ref 0.00–1.49)
INR: 1.16 (ref 0.00–1.49)
PROTHROMBIN TIME: 15 s (ref 11.6–15.2)
Prothrombin Time: 21.9 seconds — ABNORMAL HIGH (ref 11.6–15.2)

## 2015-05-28 LAB — POCT I-STAT 3, ART BLOOD GAS (G3+)
ACID-BASE EXCESS: 1 mmol/L (ref 0.0–2.0)
BICARBONATE: 26.4 meq/L — AB (ref 20.0–24.0)
O2 Saturation: 100 %
TCO2: 28 mmol/L (ref 0–100)
pCO2 arterial: 44.6 mmHg (ref 35.0–45.0)
pH, Arterial: 7.38 (ref 7.350–7.450)
pO2, Arterial: 174 mmHg — ABNORMAL HIGH (ref 80.0–100.0)

## 2015-05-28 LAB — MRSA PCR SCREENING: MRSA BY PCR: NEGATIVE

## 2015-05-28 LAB — I-STAT TROPONIN, ED: TROPONIN I, POC: 0 ng/mL (ref 0.00–0.08)

## 2015-05-28 LAB — CBC
HEMATOCRIT: 42.8 % (ref 39.0–52.0)
Hemoglobin: 14.7 g/dL (ref 13.0–17.0)
MCH: 30.5 pg (ref 26.0–34.0)
MCHC: 34.3 g/dL (ref 30.0–36.0)
MCV: 88.8 fL (ref 78.0–100.0)
Platelets: 235 10*3/uL (ref 150–400)
RBC: 4.82 MIL/uL (ref 4.22–5.81)
RDW: 12.7 % (ref 11.5–15.5)
WBC: 6 10*3/uL (ref 4.0–10.5)

## 2015-05-28 LAB — I-STAT CHEM 8, ED
BUN: 22 mg/dL — ABNORMAL HIGH (ref 6–20)
CALCIUM ION: 1.15 mmol/L (ref 1.13–1.30)
Chloride: 103 mmol/L (ref 101–111)
Creatinine, Ser: 1.2 mg/dL (ref 0.61–1.24)
GLUCOSE: 96 mg/dL (ref 65–99)
HCT: 45 % (ref 39.0–52.0)
HEMOGLOBIN: 15.3 g/dL (ref 13.0–17.0)
POTASSIUM: 4.2 mmol/L (ref 3.5–5.1)
SODIUM: 140 mmol/L (ref 135–145)
TCO2: 28 mmol/L (ref 0–100)

## 2015-05-28 LAB — DIFFERENTIAL
BASOS PCT: 1 %
Basophils Absolute: 0 10*3/uL (ref 0.0–0.1)
EOS ABS: 0.1 10*3/uL (ref 0.0–0.7)
EOS PCT: 1 %
Lymphocytes Relative: 22 %
Lymphs Abs: 1.3 10*3/uL (ref 0.7–4.0)
MONO ABS: 0.5 10*3/uL (ref 0.1–1.0)
MONOS PCT: 8 %
Neutro Abs: 4.1 10*3/uL (ref 1.7–7.7)
Neutrophils Relative %: 68 %

## 2015-05-28 LAB — SODIUM: SODIUM: 139 mmol/L (ref 135–145)

## 2015-05-28 LAB — ETHANOL

## 2015-05-28 LAB — APTT: aPTT: 37 seconds (ref 24–37)

## 2015-05-28 MED ORDER — PROPOFOL 1000 MG/100ML IV EMUL
5.0000 ug/kg/min | INTRAVENOUS | Status: DC
  Administered 2015-05-28: 50 ug/kg/min via INTRAVENOUS
  Administered 2015-05-29: 40 ug/kg/min via INTRAVENOUS
  Filled 2015-05-28 (×2): qty 100

## 2015-05-28 MED ORDER — VITAMIN K1 10 MG/ML IJ SOLN
10.0000 mg | INTRAVENOUS | Status: AC
  Administered 2015-05-28: 10 mg via INTRAVENOUS
  Filled 2015-05-28: qty 1

## 2015-05-28 MED ORDER — ETOMIDATE 2 MG/ML IV SOLN
INTRAVENOUS | Status: DC | PRN
  Administered 2015-05-28: 20 mg via INTRAVENOUS

## 2015-05-28 MED ORDER — PROTHROMBIN COMPLEX CONC HUMAN 500 UNITS IV KIT
1638.0000 [IU] | PACK | Status: AC
  Administered 2015-05-28: 1638 [IU] via INTRAVENOUS
  Filled 2015-05-28: qty 66

## 2015-05-28 MED ORDER — ACETAMINOPHEN 325 MG PO TABS: 650.0000 mg | ORAL_TABLET | ORAL | Status: DC | PRN

## 2015-05-28 MED ORDER — FENTANYL CITRATE (PF) 100 MCG/2ML IJ SOLN
50.0000 ug | Freq: Once | INTRAMUSCULAR | Status: AC
  Administered 2015-05-28: 50 ug via INTRAVENOUS
  Filled 2015-05-28: qty 2

## 2015-05-28 MED ORDER — ONDANSETRON HCL 4 MG/2ML IJ SOLN
INTRAMUSCULAR | Status: AC
  Administered 2015-05-28: 4 mg
  Filled 2015-05-28: qty 2

## 2015-05-28 MED ORDER — ETOMIDATE 2 MG/ML IV SOLN: 20.0000 mg | Freq: Once | INTRAVENOUS | Status: DC

## 2015-05-28 MED ORDER — LABETALOL HCL 5 MG/ML IV SOLN
10.0000 mg | INTRAVENOUS | Status: AC
  Administered 2015-05-28: 10 mg via INTRAVENOUS
  Filled 2015-05-28: qty 4

## 2015-05-28 MED ORDER — STROKE: EARLY STAGES OF RECOVERY BOOK
Freq: Once | Status: DC
  Filled 2015-05-28: qty 1

## 2015-05-28 MED ORDER — LABETALOL HCL 5 MG/ML IV SOLN
10.0000 mg | INTRAVENOUS | Status: DC | PRN
  Administered 2015-05-29: 40 mg via INTRAVENOUS
  Filled 2015-05-28: qty 8

## 2015-05-28 MED ORDER — SUCCINYLCHOLINE CHLORIDE 20 MG/ML IJ SOLN
INTRAMUSCULAR | Status: DC | PRN
  Administered 2015-05-28: 100 mg via INTRAVENOUS

## 2015-05-28 MED ORDER — PANTOPRAZOLE SODIUM 40 MG IV SOLR
40.0000 mg | Freq: Every day | INTRAVENOUS | Status: DC
  Administered 2015-05-28: 40 mg via INTRAVENOUS
  Filled 2015-05-28: qty 40

## 2015-05-28 MED ORDER — SUCCINYLCHOLINE CHLORIDE 20 MG/ML IJ SOLN: 100.0000 mg | Freq: Once | INTRAMUSCULAR | Status: DC

## 2015-05-28 MED ORDER — SODIUM CHLORIDE 0.9 % IV SOLN
INTRAVENOUS | Status: DC
  Administered 2015-05-28 – 2015-05-29 (×2): via INTRAVENOUS

## 2015-05-28 MED ORDER — PROPOFOL 1000 MG/100ML IV EMUL
INTRAVENOUS | Status: AC
  Filled 2015-05-28: qty 100

## 2015-05-28 MED ORDER — ACETAMINOPHEN 650 MG RE SUPP: 650.0000 mg | RECTAL | Status: DC | PRN

## 2015-05-28 MED ORDER — SENNOSIDES-DOCUSATE SODIUM 8.6-50 MG PO TABS
1.0000 | ORAL_TABLET | Freq: Two times a day (BID) | ORAL | Status: DC
  Administered 2015-05-28: 1 via ORAL
  Filled 2015-05-28: qty 1

## 2015-05-28 MED ORDER — NICARDIPINE HCL IN NACL 20-0.86 MG/200ML-% IV SOLN: 3.0000 mg/h | INTRAVENOUS | Status: DC

## 2015-05-28 NOTE — ED Notes (Signed)
1715 - pt vomited in room x 3 times. Pt a&o x4  1730 - pt not able to hold left arm or left leg against gravity. Camillo MD aware.   1800 - family at bedside.

## 2015-05-28 NOTE — Progress Notes (Signed)
Patient now in a-fib with rate going up to lower 100s

## 2015-05-28 NOTE — Progress Notes (Signed)
Dr. Nicole Kindred notified that patient is now bradycardic in the 75s. No further orders at this time. Will continue to monitor patient.

## 2015-05-28 NOTE — Consult Note (Signed)
NEURO HOSPITALIST CONSULT NOTE   Referring physician: ED Reason for Consult: code stroke, HA-left hemiparesis-dysarthria-left face weakness  HPI:                                                                                                                                          Brent Webb is an 80 y.o. male with a past medical history significant for atrial fibrillation on coumadin, s/p watchman procedure 2 weeks ago, brought in by EMS as a code stroke due to acute onset of the above stated symptoms. Patient lives at home with his wife, and today around noon time complained of having a HA and subsequently developed slurred speech, left face droopiness, and left hemiparesis. EMS was summoned and upon arrival to ED patient had SBP 162, preserved sensorium, but profuse vomiting with severe HA. CT brain revealed a large intra cerebral hematoma within RIGHT temporal lobe with evidence of rupture into the RIGHT sylvian fissure, hematoma overall measuring approximately 6.0 x 5.0 x 6.0 cm in size. INR 1.90; PTT 37, platelet count 235. SBP>162 while in the ED and received 10 mg IV labetalol.  Patient received zofran for intractable nausea and vomiting and due to extremely severe HA was given a single dose of fentanyl. His neurological status declined and was intubated in the ED for airway protection. NIHSS 8 ICH VOLUME 90 CC ICH SCORE: 2   No past medical history on file.  No past surgical history on file.  No family history on file.  Family History: unable to obtain due to mental status   Social History:  has no tobacco, alcohol, and drug history on file.  No Known Allergies  MEDICATIONS:                                                                                                                     I have reviewed the patient's current medications.   ROS:  History obtained from family,  chart review and the patient   Physical exam: (before intubation) Constitutional: well developed, pleasant male in no apparent distress, complaining of HA and with persistent vomiting. Blood pressure 129/63, pulse 40, resp. rate 15, weight 65.7 kg (144 lb 13.5 oz), SpO2 94 %. Eyes: no jaundice or exophthalmos.  Head: normocephalic. Neck: supple, no bruits, no JVD. Cardiac: no murmurs. Lungs: clear. Abdomen: soft, no tender, no mass. Extremities: no edema, clubbing, or cyanosis.  Skin: no rash  Neurologic Examination: BEFORE INTUBATION                                                                                                     General: NAD Mental Status: Alert, oriented, thought content appropriate.  Speech fluent without evidence of aphasia.  Able to follow 3 step commands without difficulty. Cranial Nerves: II: Discs flat bilaterally; Visual fields grossly normal, pupils equal, round, reactive to light and accommodation III,IV, VI: ptosis not present, extra-ocular motions intact bilaterally V,VII: smile asymmetric due to left face weakness, facial light touch sensation normal bilaterally VIII: hearing normal bilaterally IX,X: uvula rises symmetrically XI: bilateral shoulder shrug no tested XII: midline tongue extension without atrophy or fasciculations Motor: Significant for left hemiparesis Tone flaccid in the left Sensory: Pinprick and light touch intact throughout, bilaterally Deep Tendon Reflexes:  Right: Upper Extremity   Left: Upper extremity   biceps (C-5 to C-6) 2/4   biceps (C-5 to C-6) 2/4 tricep (C7) 2/4    triceps (C7) 2/4 Brachioradialis (C6) 2/4  Brachioradialis (C6) 2/4  Lower Extremity Lower Extremity  quadriceps (L-2 to L-4) 2/4   quadriceps (L-2 to L-4) 2/4 Achilles (S1) 2/4   Achilles (S1) 2/4  Plantars: Right: downgoing   Left: upgoing Cerebellar: normal finger-to-nose in the right, can  not perform HKS or FTN in the left due to weakness Gait:  Unable to test due to mental status    No results found for: CHOL  Results for orders placed or performed during the hospital encounter of 05/17/2015 (from the past 48 hour(s))  Ethanol     Status: None   Collection Time: 05/26/2015  5:04 PM  Result Value Ref Range   Alcohol, Ethyl (B) <5 <5 mg/dL    Comment:        LOWEST DETECTABLE LIMIT FOR SERUM ALCOHOL IS 5 mg/dL FOR MEDICAL PURPOSES ONLY   Protime-INR     Status: Abnormal   Collection Time: 05/21/2015  5:04 PM  Result Value Ref Range   Prothrombin Time 21.9 (H) 11.6 - 15.2 seconds   INR 1.92 (H) 0.00 - 1.49  APTT     Status: None   Collection Time: 05/17/2015  5:04 PM  Result Value Ref Range   aPTT 37 24 - 37 seconds    Comment:        IF BASELINE aPTT IS ELEVATED, SUGGEST PATIENT RISK ASSESSMENT BE USED TO DETERMINE APPROPRIATE ANTICOAGULANT THERAPY.   CBC     Status: None   Collection Time: 06/05/2015  5:04 PM  Result Value Ref Range   WBC  6.0 4.0 - 10.5 K/uL   RBC 4.82 4.22 - 5.81 MIL/uL   Hemoglobin 14.7 13.0 - 17.0 g/dL   HCT 42.8 39.0 - 52.0 %   MCV 88.8 78.0 - 100.0 fL   MCH 30.5 26.0 - 34.0 pg   MCHC 34.3 30.0 - 36.0 g/dL   RDW 12.7 11.5 - 15.5 %   Platelets 235 150 - 400 K/uL  Differential     Status: None   Collection Time: 05/27/2015  5:04 PM  Result Value Ref Range   Neutrophils Relative % 68 %   Neutro Abs 4.1 1.7 - 7.7 K/uL   Lymphocytes Relative 22 %   Lymphs Abs 1.3 0.7 - 4.0 K/uL   Monocytes Relative 8 %   Monocytes Absolute 0.5 0.1 - 1.0 K/uL   Eosinophils Relative 1 %   Eosinophils Absolute 0.1 0.0 - 0.7 K/uL   Basophils Relative 1 %   Basophils Absolute 0.0 0.0 - 0.1 K/uL  Comprehensive metabolic panel     Status: Abnormal   Collection Time: 05/14/2015  5:04 PM  Result Value Ref Range   Sodium 139 135 - 145 mmol/L   Potassium 4.5 3.5 - 5.1 mmol/L   Chloride 106 101 - 111 mmol/L   CO2 26 22 - 32 mmol/L   Glucose, Bld 99 65 - 99 mg/dL    BUN 17 6 - 20 mg/dL   Creatinine, Ser 1.30 (H) 0.61 - 1.24 mg/dL   Calcium 9.5 8.9 - 10.3 mg/dL   Total Protein 6.1 (L) 6.5 - 8.1 g/dL   Albumin 3.6 3.5 - 5.0 g/dL   AST 27 15 - 41 U/L   ALT 18 17 - 63 U/L   Alkaline Phosphatase 62 38 - 126 U/L   Total Bilirubin 0.7 0.3 - 1.2 mg/dL   GFR calc non Af Amer 51 (L) >60 mL/min   GFR calc Af Amer 59 (L) >60 mL/min    Comment: (NOTE) The eGFR has been calculated using the CKD EPI equation. This calculation has not been validated in all clinical situations. eGFR's persistently <60 mL/min signify possible Chronic Kidney Disease.    Anion gap 7 5 - 15  I-stat troponin, ED (not at [facility])     Status: None   Collection Time: 06/05/2015  5:09 PM  Result Value Ref Range   Troponin i, poc 0.00 0.00 - 0.08 ng/mL   Comment 3            Comment: Due to the release kinetics of cTnI, a negative result within the first hours of the onset of symptoms does not rule out myocardial infarction with certainty. If myocardial infarction is still suspected, repeat the test at appropriate intervals.   I-Stat Chem 8, ED  (not at [facility])     Status: Abnormal   Collection Time: 05/31/2015  5:11 PM  Result Value Ref Range   Sodium 140 135 - 145 mmol/L   Potassium 4.2 3.5 - 5.1 mmol/L   Chloride 103 101 - 111 mmol/L   BUN 22 (H) 6 - 20 mg/dL   Creatinine, Ser 1.20 0.61 - 1.24 mg/dL   Glucose, Bld 96 65 - 99 mg/dL   Calcium, Ion 1.15 1.13 - 1.30 mmol/L   TCO2 28 0 - 100 mmol/L   Hemoglobin 15.3 13.0 - 17.0 g/dL   HCT 45.0 39.0 - 52.0 %    Ct Head Wo Contrast  05/20/2015  CLINICAL DATA:  Code stroke, LEFT-sided weakness, headache, restlessness EXAM: CT  HEAD WITHOUT CONTRAST TECHNIQUE: Contiguous axial images were obtained from the base of the skull through the vertex without intravenous contrast. COMPARISON:  None FINDINGS: Mild atrophy. Large intra cerebral hematoma identified within RIGHT temporal lobe with rupture into the sylvian fissure, hematoma  overall measuring 6.0 x 5.0 x ~6 cm (estimated volume 90 ml). Surrounding vasogenic edema in the posterior RIGHT parietal region. Mass effect within the RIGHT hemisphere with mild compression of the RIGHT lateral ventricle and approximately 4 mm of RIGHT to LEFT midline shift. No additional intracranial hemorrhage, additional areas of infarction or mass. Basilar cisterns do not appear effaced. Tiny amount of fluid or mucus within sphenoid sinus. Bones and sinuses otherwise unremarkable. IMPRESSION: Large intra cerebral hematoma within RIGHT temporal lobe with evidence of rupture into the RIGHT sylvian fissure, hematoma overall measuring approximately 6.0 x 5.0 x 6.0 cm in size. Edema with thin RIGHT hemisphere with mass effect upon RIGHT lateral ventricle and 4 mm of RIGHT to LEFT midline shift. Findings called to [place] on 06/03/2015 at 1731 hours. Electronically Signed   By: Lavonia Dana M.D.   On: 05/14/2015 17:32   Assessment/Plan: 80 y/o brought in with acute onset HA, left hemiparesis, and left face weakness .CT brain revealed a large intra cerebral hematoma within RIGHT temporal lobe with evidence of rupture into the RIGHT sylvian fissure, hematoma overall measuring approximately 6.0 x 5.0 x 6.0 cm in size and hematoma volume 90 cc. ICH score 2. INR 1.90 but suspect etiology likely a combination of HTN+anticoagulation, thus will reverse coumadin. Intubated in the ED. Recommend: 1) Admit to NICU. 2) Reverse coumadin as above. 3) Start hypertonic saline (called critical care consult for placement of central line and ventilator management). 4) Nicardipine drip, target SBP<160 5) Follow up CT head in 24 hours or before if clinically warranted. Family at the bedside were updated regarding patient critical condition. Stroke team will follow up tomorrow.  Dorian Pod, MD 06/03/2015, 6:05 PM  [facility]

## 2015-05-28 NOTE — Telephone Encounter (Signed)
Patient called with headache. Spoke with wife and patient. He has a history of recurrent intracerebral hemorrhages. He is having pressure on the right side of his head and temple area for a few hours. He has been laying in bed. The pressure is painful. He has taken 2 tylenols 500mg . He was recently started on Warfarin a month ago. Headache B@is  stable. It is 8/10, his right eye is hurting. No light or sound sensitivity. No weakness, no facial droop, no focal neurologic deficits. Had a similar incident in August and proceeded to the ED and CT was negative for acute abnormality.  I think since he is not having any focal neurologic deficit and no altered mentation or any other associated symptoms, then they can wait a little bit possible an hour and see if the tylenol helps  however if headache persists, worsens, there is altered mentatoin, focal neurologic deficits then I do recommend calling 911 and proceeding to the emergency for a CT of the head especially given previous history and recent start of anticoagulation. Patient should rest but wife should wake him if he falls asleep to ensure no mentation changes. They are encouraged to call back with any changes. No trauma or inciting events.

## 2015-05-28 NOTE — ED Notes (Signed)
Per EMS - pt LKW 1200. With family, began having headache, left-sided weakness and facial droop. Symptoms not resolving. Upon arrival able to move left arm but not able to hold against gravity. Able to hold left leg up against gravity. Left-sided facial droop noted. Mildly slurred speech. Mildly tachycardic

## 2015-05-28 NOTE — ED Provider Notes (Signed)
CSN: [id number]     Arrival date & time 06/10/2015  1703 History   First MD Initiated Contact with Patient 05/12/2015 1704     No chief complaint on file.    (Consider location/radiation/quality/duration/timing/severity/associated sxs/prior Treatment) HPI This elderly male presents emergently as a code stroke. The patient is unable to provide full details of the event. Level V caveat.  No past medical history on file. No past surgical history on file. No family history on file. Social History  Substance Use Topics  . Smoking status: Not on file  . Smokeless tobacco: Not on file  . Alcohol Use: Not on file    Review of Systems  Unable to perform ROS: Mental status change      Allergies  Review of patient's allergies indicates not on file.  Home Medications   Prior to Admission medications   Not on File   Wt 144 lb 13.5 oz (65.7 kg) Physical Exam  Constitutional: He appears listless. He appears distressed.  Unwell appearing thin elderly male listless, but responds to stimuli  HENT:  Head: Normocephalic and atraumatic.  Eyes: Conjunctivae and EOM are normal.  Neck: No tracheal deviation present.  Cardiovascular: Normal rate and regular rhythm.   Pulmonary/Chest: Effort normal. No stridor. No respiratory distress.  Abdominal: He exhibits no distension. There is no tenderness.  Musculoskeletal: He exhibits no edema.  Neurological: He appears listless. He displays no tremor. A cranial nerve deficit is present. He exhibits abnormal muscle tone. He displays no seizure activity. Coordination abnormal.  Left upper extremity strength deficit, left lower extremity strength deficit, speech is unclear, no gross facial asymmetry  Skin: Skin is warm. He is diaphoretic.  Psychiatric: He has a normal mood and affect.  Nursing note and vitals reviewed.   ED Course  Procedures (including critical care time) Labs Review Labs Reviewed  PROTIME-INR - Abnormal; Notable for the  following:    Prothrombin Time 21.9 (*)    INR 1.92 (*)    All other components within normal limits  COMPREHENSIVE METABOLIC PANEL - Abnormal; Notable for the following:    Creatinine, Ser 1.30 (*)    Total Protein 6.1 (*)    GFR calc non Af Amer 51 (*)    GFR calc Af Amer 59 (*)    All other components within normal limits  I-STAT CHEM 8, ED - Abnormal; Notable for the following:    BUN 22 (*)    All other components within normal limits  ETHANOL  APTT  CBC  DIFFERENTIAL  URINE RAPID DRUG SCREEN, HOSP PERFORMED  URINALYSIS, ROUTINE W REFLEX MICROSCOPIC (NOT AT [facility])  SODIUM  SODIUM  SODIUM  I-STAT TROPOININ, ED    Imaging Review Ct Head Wo Contrast  05/11/2015  CLINICAL DATA:  Code stroke, LEFT-sided weakness, headache, restlessness EXAM: CT HEAD WITHOUT CONTRAST TECHNIQUE: Contiguous axial images were obtained from the base of the skull through the vertex without intravenous contrast. COMPARISON:  None FINDINGS: Mild atrophy. Large intra cerebral hematoma identified within RIGHT temporal lobe with rupture into the sylvian fissure, hematoma overall measuring 6.0 x 5.0 x ~6 cm (estimated volume 90 ml). Surrounding vasogenic edema in the posterior RIGHT parietal region. Mass effect within the RIGHT hemisphere with mild compression of the RIGHT lateral ventricle and approximately 4 mm of RIGHT to LEFT midline shift. No additional intracranial hemorrhage, additional areas of infarction or mass. Basilar cisterns do not appear effaced. Tiny amount of fluid or mucus within sphenoid sinus. Bones and sinuses otherwise  unremarkable. IMPRESSION: Large intra cerebral hematoma within RIGHT temporal lobe with evidence of rupture into the RIGHT sylvian fissure, hematoma overall measuring approximately 6.0 x 5.0 x 6.0 cm in size. Edema with thin RIGHT hemisphere with mass effect upon RIGHT lateral ventricle and 4 mm of RIGHT to LEFT midline shift. Findings called to [place] on 05/19/2015 at 1731  hours. Electronically Signed   By: Lavonia Dana M.D.   On: 05/26/2015 17:32   I have personally reviewed and evaluated these images and lab results as part of my medical decision-making.  Patient's CT scan was notable for bleed, and given his hypertension, he received labetalol, fentanyl. Case managed early on with our neurology team.   EKG Interpretation   Date/Time:  Sunday May 28 2015 17:23:32 EST Ventricular Rate:  80 PR Interval:  188 QRS Duration: 98 QT Interval:  373 QTC Calculation: 430 R Axis:   87 Text Interpretation:  Sinus rhythm Borderline right axis deviation  Abnormal T, consider ischemia, lateral leads Minimal ST elevation,  anterior leads Sinus rhythm Artifact T wave abnormality Premature atrial  complexes Abnormal ekg Confirmed by Carmin Muskrat  MD ([id number]) on  06/01/2015 5:30:38 PM       Update: BP improving, but patient remains nauseous w substantial emesis. I discussed the need for intubation with patient wife, one more family member. Agreed that the patient would be interested in this intervention. Patient is unable to provide verbal consent given his mental status.   INTUBATION Performed by: Carmin Muskrat  Required items: required blood products, implants, devices, and special equipment available Patient identity confirmed: provided demographic data and hospital-assigned identification number Time out: Immediately prior to procedure a "time out" was called to verify the correct patient, procedure, equipment, support staff and site/side marked as required.  Indications: airway protection, ICH  Intubation method: Glidescope Laryngoscopy   Preoxygenation: [place] w 100% sat and minimal time to intubation following meds   Sedatives: 20Etomidate Paralytic: 100 Succinylcholine  Tube Size: 7.5 cuffed  Post-procedure assessment: chest rise and ETCO2 monitor Breath sounds: equal and absent over the epigastrium Tube secured with: ETT holder Chest  x-ray interpreted by radiologist and me.  Chest x-ray findings: endotracheal tube in appropriate position  Patient tolerated the procedure well with no immediate complications.  following intubation, the patient had initiation of KCentra to reverse the Coumadin effects.    MDM  Patient presents with acute onset of left-sided weakness. Here, the patient is awake, but listless. Patient has new left-sided deficits, and head CT is immediately notable for intracranial hemorrhage. Notably, the patient is on Coumadin, had recent procedure for atrial fibrillation. Given his hypertension, he required blood pressure lung medication. Given the bleed, Coumadin use, he was also provided with reversal agent. For airway protection, the patient regarding intubation, this was accomplished without complication.  Patient required admission to the ICU for further evaluation, management.  CRITICAL CARE Performed by: Carmin Muskrat Total critical care time: 45 minutes Critical care time was exclusive of separately billable procedures and treating other patients. Critical care was necessary to treat or prevent imminent or life-threatening deterioration. Critical care was time spent personally by me on the following activities: development of treatment plan with patient and/or surrogate as well as nursing, discussions with consultants, evaluation of patient's response to treatment, examination of patient, obtaining history from patient or surrogate, ordering and performing treatments and interventions, ordering and review of laboratory studies, ordering and review of radiographic studies, pulse oximetry and re-evaluation of patient's condition.  Carmin Muskrat, MD 05/27/2015 [phone]

## 2015-05-28 NOTE — Consult Note (Signed)
PULMONARY / CRITICAL CARE MEDICINE   Name: Brent Webb MRN: [id number] DOB: 1935/06/07    ADMISSION DATE:  05/25/2015 CONSULTATION DATE:  12/18  REFERRING MD:  Neuro  CHIEF COMPLAINT:  AMS  HISTORY OF PRESENT ILLNESS:   80 yo wm with hx of a fib who complained of HA, nausea, left facial droop, slurred speech and rt pupil dilatation  Transported to [facility] ED  At 12 nwere CT confirmed large  rt ICH and he was intubated and transferred to NSICU. PCCM was asked to help manage in ICU. Originally he was to receive 3% saline but this was canceled by the neuro team.  PAST MEDICAL HISTORY :  He  has a past medical history of Stroke ([place]) (2014, 2015).  PAST SURGICAL HISTORY: He  has no past surgical history on file.  No Known Allergies  No current facility-administered medications on file prior to encounter.   No current outpatient prescriptions on file prior to encounter.    FAMILY HISTORY:  His has no family status information on file.   SOCIAL HISTORY: NA REVIEW OF SYSTEMS:   NA  SUBJECTIVE:  NA  VITAL SIGNS: BP 120/70 mmHg  Pulse 85  Resp 17  Wt 144 lb 13.5 oz (65.7 kg)  SpO2 99%  HEMODYNAMICS:    VENTILATOR SETTINGS: Vent Mode:  [-] PRVC FiO2 (%):  [40 %] 40 % Set Rate:  [14 bmp] 14 bmp Vt Set:  [500 mL] 500 mL PEEP:  [5 cmH20] 5 cmH20 Plateau Pressure:  [11 cmH20-12 cmH20] 11 cmH20  INTAKE / OUTPUT:    PHYSICAL EXAMINATION: General:  WNWDWM sedated on vent Neuro:  Extends with upper ext and wds lower ext HEENT:  Rt pupil 6 left 3 reactive, ott Cardiovascular:  hsir Lungs:  cta Abdomen:  OFT +BS Skin:  Warm and dry  LABS:  BMET  Recent Labs Lab 05/26/2015 1704 05/22/2015 1711  NA 139 140  K 4.5 4.2  CL 106 103  CO2 26  --   BUN 17 22*  CREATININE 1.30* 1.20  GLUCOSE 99 96    Electrolytes  Recent Labs Lab 05/17/2015 1704  CALCIUM 9.5    CBC  Recent Labs Lab 06/03/2015 1704 05/25/2015 1711  WBC 6.0  --   HGB 14.7 15.3  HCT 42.8 45.0    PLT 235  --     Coag's  Recent Labs Lab 06/03/2015 1704 06/09/2015 1946  APTT 37  --   INR 1.92* 1.16    Sepsis Markers No results for input(s): LATICACIDVEN, PROCALCITON, O2SATVEN in the last 168 hours.  ABG No results for input(s): PHART, PCO2ART, PO2ART in the last 168 hours.  Liver Enzymes  Recent Labs Lab 06/08/2015 1704  AST 27  ALT 18  ALKPHOS 62  BILITOT 0.7  ALBUMIN 3.6    Cardiac Enzymes No results for input(s): TROPONINI, PROBNP in the last 168 hours.  Glucose No results for input(s): GLUCAP in the last 168 hours.  Imaging Ct Head Wo Contrast  06/09/2015  CLINICAL DATA:  Code stroke, LEFT-sided weakness, headache, restlessness EXAM: CT HEAD WITHOUT CONTRAST TECHNIQUE: Contiguous axial images were obtained from the base of the skull through the vertex without intravenous contrast. COMPARISON:  None FINDINGS: Mild atrophy. Large intra cerebral hematoma identified within RIGHT temporal lobe with rupture into the sylvian fissure, hematoma overall measuring 6.0 x 5.0 x ~6 cm (estimated volume 90 ml). Surrounding vasogenic edema in the posterior RIGHT parietal region. Mass effect within the RIGHT hemisphere with mild  compression of the RIGHT lateral ventricle and approximately 4 mm of RIGHT to LEFT midline shift. No additional intracranial hemorrhage, additional areas of infarction or mass. Basilar cisterns do not appear effaced. Tiny amount of fluid or mucus within sphenoid sinus. Bones and sinuses otherwise unremarkable. IMPRESSION: Large intra cerebral hematoma within RIGHT temporal lobe with evidence of rupture into the RIGHT sylvian fissure, hematoma overall measuring approximately 6.0 x 5.0 x 6.0 cm in size. Edema with thin RIGHT hemisphere with mass effect upon RIGHT lateral ventricle and 4 mm of RIGHT to LEFT midline shift. Findings called to [place] on 05/15/2015 at 1731 hours. Electronically Signed   By: Lavonia Dana M.D.   On: 05/27/2015 17:32   Dg Chest  Portable 1 View  05/25/2015  CLINICAL DATA:  Code stroke.  Intubated patient. EXAM: PORTABLE CHEST 1 VIEW COMPARISON:  None. FINDINGS: 1836 hours. The patient is mildly rotated to the left. The tip of the endotracheal tube is in the midtrachea, approximately 4 cm above the carina. Nasogastric tube projects below the diaphragm with the side hole near the GE junction. The heart size and mediastinal contours are normal. The lungs are clear. There is no pleural effusion or pneumothorax. Multiple telemetry leads overlie the chest. IMPRESSION: The endotracheal and nasogastric tubes appears satisfactorily positioned. No acute chest findings. Electronically Signed   By: Richardean Sale M.D.   On: 05/23/2015 19:10     STUDIES:  CT as noted  CULTURES:   ANTIBIOTICS:   SIGNIFICANT EVENTS:   LINES/TUBES: 12/18 OTT>>  DISCUSSION: 80 yo wm with hx of a fib who complained of HA, nausea, left facial droop, slurred speech and rt pupil dilatation  Transported to [facility] ED  At 12 nwere CT confirmed large  rt ICH and he was intubated and transferred to NSICU. PCCM was asked to help manage in ICU. Originally he was to receive 3% saline but this was canceled by the neuro team.  ASSESSMENT / PLAN:  PULMONARY A: VDRF due to RT ICH and AMS and inability to protect airway P:   Vent bundle  CARDIOVASCULAR A:  HTN Hx of a fib P:  BP control per neuro  RENAL A:   No acute issue P:     GASTROINTESTINAL A:   GI protection P:   PPI  HEMATOLOGIC A:   He was given Vit K and Kcentra per neuro P:    INFECTIOUS A:   No acute issue P:    ENDOCRINE A:   No acute issue P:     NEUROLOGIC A:   Rt ICH at rt temporal  Lobe  P:   RASS goal: -1 Sedation for tube tolerance   FAMILY  - Updates:   - Inter-disciplinary family meet or Palliative Care meeting due by:  day [place] ACNP Maryanna Shape PCCM Pager [phone] till 3 pm If no answer page [phone] 05/16/2015, 8:16 PM CC  time 40 min

## 2015-05-28 NOTE — Progress Notes (Signed)
Patient transported to 3M06 from ED. No complications. Vital signs stable throughout. Patient tolerated well. RT will continue to monitor.

## 2015-05-29 ENCOUNTER — Inpatient Hospital Stay (HOSPITAL_COMMUNITY): Payer: Medicare Other

## 2015-05-29 ENCOUNTER — Encounter (HOSPITAL_COMMUNITY): Payer: Self-pay | Admitting: Emergency Medicine

## 2015-05-29 ENCOUNTER — Ambulatory Visit (INDEPENDENT_AMBULATORY_CARE_PROVIDER_SITE_OTHER): Payer: Medicare Other | Admitting: *Deleted

## 2015-05-29 DIAGNOSIS — I4891 Unspecified atrial fibrillation: Secondary | ICD-10-CM

## 2015-05-29 DIAGNOSIS — I639 Cerebral infarction, unspecified: Secondary | ICD-10-CM | POA: Diagnosis not present

## 2015-05-29 DIAGNOSIS — J9690 Respiratory failure, unspecified, unspecified whether with hypoxia or hypercapnia: Secondary | ICD-10-CM | POA: Insufficient documentation

## 2015-05-29 DIAGNOSIS — J9600 Acute respiratory failure, unspecified whether with hypoxia or hypercapnia: Secondary | ICD-10-CM

## 2015-05-29 DIAGNOSIS — I619 Nontraumatic intracerebral hemorrhage, unspecified: Principal | ICD-10-CM

## 2015-05-29 LAB — CBC
HCT: 44 % (ref 39.0–52.0)
Hemoglobin: 14.9 g/dL (ref 13.0–17.0)
MCH: 29.7 pg (ref 26.0–34.0)
MCHC: 33.9 g/dL (ref 30.0–36.0)
MCV: 87.6 fL (ref 78.0–100.0)
PLATELETS: 254 10*3/uL (ref 150–400)
RBC: 5.02 MIL/uL (ref 4.22–5.81)
RDW: 12.8 % (ref 11.5–15.5)
WBC: 12.4 10*3/uL — AB (ref 4.0–10.5)

## 2015-05-29 LAB — PHOSPHORUS: Phosphorus: 4 mg/dL (ref 2.5–4.6)

## 2015-05-29 LAB — APTT: aPTT: 25 seconds (ref 24–37)

## 2015-05-29 LAB — BASIC METABOLIC PANEL
ANION GAP: 12 (ref 5–15)
BUN: 15 mg/dL (ref 6–20)
CALCIUM: 9.3 mg/dL (ref 8.9–10.3)
CO2: 27 mmol/L (ref 22–32)
CREATININE: 1.26 mg/dL — AB (ref 0.61–1.24)
Chloride: 104 mmol/L (ref 101–111)
GFR calc Af Amer: >60 mL/min (ref >60)
GFR, EST NON AFRICAN AMERICAN: 53 mL/min — AB (ref >60)
GLUCOSE: 131 mg/dL — AB (ref 65–99)
Potassium: 4 mmol/L (ref 3.5–5.1)
Sodium: 143 mmol/L (ref 135–145)

## 2015-05-29 LAB — PROTIME-INR
INR: 1.07 (ref 0.00–1.49)
INR: 1.04 (ref 0.00–1.49)
INR: 1.07 (ref 0.00–1.49)
PROTHROMBIN TIME: 14.1 s (ref 11.6–15.2)
Prothrombin Time: 13.8 seconds (ref 11.6–15.2)
Prothrombin Time: 14.1 seconds (ref 11.6–15.2)

## 2015-05-29 LAB — MAGNESIUM: Magnesium: 2.1 mg/dL (ref 1.7–2.4)

## 2015-05-29 MED ORDER — MORPHINE BOLUS VIA INFUSION
5.0000 mg | INTRAVENOUS | Status: DC | PRN
  Filled 2015-05-29: qty 20

## 2015-05-29 MED ORDER — LORAZEPAM 2 MG/ML IJ SOLN
INTRAMUSCULAR | Status: AC
  Filled 2015-05-29: qty 1

## 2015-05-29 MED ORDER — ANTISEPTIC ORAL RINSE SOLUTION (CORINZ)
7.0000 mL | OROMUCOSAL | Status: DC
  Administered 2015-05-29 (×3): 7 mL via OROMUCOSAL

## 2015-05-29 MED ORDER — CHLORHEXIDINE GLUCONATE 0.12% ORAL RINSE (MEDLINE KIT)
15.0000 mL | Freq: Two times a day (BID) | OROMUCOSAL | Status: DC
  Administered 2015-05-29: 15 mL via OROMUCOSAL

## 2015-05-29 MED ORDER — SODIUM CHLORIDE 0.9 % IV SOLN
1000.0000 mg | Freq: Once | INTRAVENOUS | Status: DC | PRN
  Filled 2015-05-29: qty 10

## 2015-05-29 MED ORDER — MORPHINE SULFATE 25 MG/ML IV SOLN
10.0000 mg/h | INTRAVENOUS | Status: DC
  Administered 2015-05-29: 10 mg/h via INTRAVENOUS
  Filled 2015-05-29: qty 10

## 2015-05-29 NOTE — Progress Notes (Signed)
Dr. Nicole Kindred notified of possible seizure activity. Patient jerking in bed and threw upper torso off of bed multiple times then fell back. Stated to repeat head CT and give 1,000 mg of keppra iv if seizure activity starts again.

## 2015-05-29 NOTE — Progress Notes (Signed)
Patient was terminally extubated at 1332.  Wife, 3 sons and daughter, as well as other relatives were at bedside.   Margo Aye, RN and Gilford Rile, RN auscultated for one minute with no heart sounds heard.  Patient time of death declared at 10. Patient was transported to [place] at 1520 with transport service contacted to transport to [facility]. [place], [place] C 05/26/2015 3:22 PM

## 2015-05-29 NOTE — Consult Note (Signed)
PULMONARY / CRITICAL CARE MEDICINE   Name: Brent Webb MRN: [id number] DOB: 07/09/1934    ADMISSION DATE:  06/06/2015 CONSULTATION DATE:  12/18  REFERRING MD:  Neuro  CHIEF COMPLAINT:  AMS  HISTORY OF PRESENT ILLNESS:   80 yo wm with hx of a fib who complained of HA, nausea, left facial droop, slurred speech and rt pupil dilatation  Transported to [facility] ED  At 12 nwere CT confirmed large  rt ICH and he was intubated and transferred to NSICU. PCCM was asked to help manage in ICU. Originally he was to receive 3% saline but this was canceled by the neuro team.  SUBJECTIVE:  Discussed case w Dr Leonie Man who has reviewed w family. They are ready for withdrawal of care.   VITAL SIGNS: BP 74/48 mmHg  Pulse 65  Temp(Src) 97.4 F (36.3 C) (Axillary)  Resp 20  Ht 6' (1.829 m)  Wt 65.3 kg (143 lb 15.4 oz)  BMI 19.52 kg/m2  SpO2 100%  HEMODYNAMICS:    VENTILATOR SETTINGS: Vent Mode:  [-] PRVC FiO2 (%):  [30 %-40 %] 30 % Set Rate:  [14 bmp] 14 bmp Vt Set:  [500 mL] 500 mL PEEP:  [5 cmH20] 5 cmH20 Plateau Pressure:  [10 cmH20-13 cmH20] 13 cmH20  INTAKE / OUTPUT: I/O last 3 completed shifts: In: 922.5 [I.V.:922.5] Out: 1700 [Urine:1700]  PHYSICAL EXAMINATION: General:  Ill appearing, sedated on vent Neuro:  Extends with upper ext and wds lower ext HEENT:  Rt pupil 6 left 58mm, ETT in place Cardiovascular: distant, reg Lungs: Clear B  Abdomen: soft, benign,  Skin:  Warm and dry  LABS:  BMET  Recent Labs Lab 05/14/2015 1704 05/31/2015 1711 05/14/2015 1946 05/18/2015 0548  NA 139 140 139 143  K 4.5 4.2  --  4.0  CL 106 103  --  104  CO2 26  --   --  27  BUN 17 22*  --  15  CREATININE 1.30* 1.20  --  1.26*  GLUCOSE 99 96  --  131*    Electrolytes  Recent Labs Lab 05/11/2015 1704 05/16/2015 0548  CALCIUM 9.5 9.3  MG  --  2.1  PHOS  --  4.0    CBC  Recent Labs Lab 06/10/2015 1704 05/26/2015 1711 05/21/2015 0548  WBC 6.0  --  12.4*  HGB 14.7 15.3 14.9  HCT 42.8 45.0  44.0  PLT 235  --  254    Coag's  Recent Labs Lab 05/23/2015 1704  05/18/2015 0145 05/13/2015 0548 05/15/2015 0821  APTT 37  --   --  25  --   INR 1.92*  < > 1.07 1.04 1.07  < > = values in this interval not displayed.  Sepsis Markers No results for input(s): LATICACIDVEN, PROCALCITON, O2SATVEN in the last 168 hours.  ABG  Recent Labs Lab 05/16/2015 2032  PHART 7.380  PCO2ART 44.6  PO2ART 174.0*    Liver Enzymes  Recent Labs Lab 06/03/2015 1704  AST 27  ALT 18  ALKPHOS 62  BILITOT 0.7  ALBUMIN 3.6    Cardiac Enzymes No results for input(s): TROPONINI, PROBNP in the last 168 hours.  Glucose No results for input(s): GLUCAP in the last 168 hours.  Imaging Ct Head Wo Contrast  06/02/2015  CLINICAL DATA:  Acute onset of stroke symptoms yesterday with left-sided weakness. Subsequent seizures. Intracranial hemorrhage. EXAM: CT HEAD WITHOUT CONTRAST TECHNIQUE: Contiguous axial images were obtained from the base of the skull through the vertex without intravenous  contrast. COMPARISON:  05/25/2015 FINDINGS: There is continued enlargement of the intraparenchymal hematoma in the right hemisphere, having enlarged from a diameter of 5 x 6 cm to a diameter of 7 x 10 cm. There is subarachnoid penetration with blood evident in the sulci and dependent within the ventricles. There is right-to-left shift of 1.5 cm. There is uncal herniation with low-density of the brainstem and areas of hemorrhage worrisome for hemorrhagic infarction in that region. There is trapping of the left lateral ventricle with developing dilatation of the structure. IMPRESSION: Massive increase in size of the right hemispheric intraparenchymal hematoma, now 7 x 10 cm. Subarachnoid penetration. Uncal herniation. Likely hemorrhagic infarction of the brainstem. Trapping of the left lateral ventricle. Critical Value/emergent results were called by telephone at the time of interpretation on 05/15/2015 at 7:15 am to Dr. Wallie Char , who verbally acknowledged these results. Electronically Signed   By: Nelson Chimes M.D.   On: 05/11/2015 07:16   Ct Head Wo Contrast  06/03/2015  CLINICAL DATA:  Code stroke, LEFT-sided weakness, headache, restlessness EXAM: CT HEAD WITHOUT CONTRAST TECHNIQUE: Contiguous axial images were obtained from the base of the skull through the vertex without intravenous contrast. COMPARISON:  None FINDINGS: Mild atrophy. Large intra cerebral hematoma identified within RIGHT temporal lobe with rupture into the sylvian fissure, hematoma overall measuring 6.0 x 5.0 x ~6 cm (estimated volume 90 ml). Surrounding vasogenic edema in the posterior RIGHT parietal region. Mass effect within the RIGHT hemisphere with mild compression of the RIGHT lateral ventricle and approximately 4 mm of RIGHT to LEFT midline shift. No additional intracranial hemorrhage, additional areas of infarction or mass. Basilar cisterns do not appear effaced. Tiny amount of fluid or mucus within sphenoid sinus. Bones and sinuses otherwise unremarkable. IMPRESSION: Large intra cerebral hematoma within RIGHT temporal lobe with evidence of rupture into the RIGHT sylvian fissure, hematoma overall measuring approximately 6.0 x 5.0 x 6.0 cm in size. Edema with thin RIGHT hemisphere with mass effect upon RIGHT lateral ventricle and 4 mm of RIGHT to LEFT midline shift. Findings called to [place] on 05/14/2015 at 1731 hours. Electronically Signed   By: Lavonia Dana M.D.   On: 05/15/2015 17:32   Dg Chest Port 1 View  06/05/2015  CLINICAL DATA:  Status post intracranial hemorrhage. Intubated patient. EXAM: PORTABLE CHEST 1 VIEW COMPARISON:  Single view of the chest 05/22/2015. FINDINGS: NG tube and endotracheal tube are again seen and project in good position. Lungs are clear. Heart size is normal. No pneumothorax or pleural effusion. Loop recorder is noted. IMPRESSION: Support apparatus projects in good position.  No acute abnormality. Electronically  Signed   By: Inge Rise M.D.   On: 06/03/2015 07:40   Dg Chest Portable 1 View  05/31/2015  CLINICAL DATA:  Code stroke.  Intubated patient. EXAM: PORTABLE CHEST 1 VIEW COMPARISON:  None. FINDINGS: 1836 hours. The patient is mildly rotated to the left. The tip of the endotracheal tube is in the midtrachea, approximately 4 cm above the carina. Nasogastric tube projects below the diaphragm with the side hole near the GE junction. The heart size and mediastinal contours are normal. The lungs are clear. There is no pleural effusion or pneumothorax. Multiple telemetry leads overlie the chest. IMPRESSION: The endotracheal and nasogastric tubes appears satisfactorily positioned. No acute chest findings. Electronically Signed   By: Richardean Sale M.D.   On: 05/24/2015 19:10     STUDIES:  CT as noted  LINES/TUBES: 12/18 OTT>>  DISCUSSION: 80  yo wm with hx of a fib who complained of HA, nausea, left facial droop, slurred speech and rt pupil dilatation  Transported to [facility] ED  At 12 nwere CT confirmed large  rt ICH and he was intubated and transferred to NSICU. PCCM was asked to help manage in ICU. Originally he was to receive 3% saline but this was canceled by the neuro team.  ASSESSMENT / PLAN:  PULMONARY A: VDRF due to RT ICH and AMS and inability to protect airway P:   Based on neuro prognosis will plan to extubate to comfort. Orders entered 12/19  CARDIOVASCULAR A:  HTN Hx of a fib P:  BP control per neuro  HEMATOLOGIC P:  He was given empiric Vit K and Kcentra per neuro   NEUROLOGIC A:   Rt ICH at rt temporal  Lobe  P:   P[lan for withdrawal of care 12/19.    FAMILY  - Updates: GOC addressed with family by Dr Leonie Man 12/19   Baltazar Apo, MD, PhD 05/26/2015, 12:34 PM [facility] [phone] or if no answer [phone]

## 2015-05-29 NOTE — Progress Notes (Signed)
[facility] regarding patients pre-arranged registration to, upon death, donate his body to [facility].  Patient's spouse will fill out release form and we will arrange for transportation of the body at time of death. Brent Webb, Brent Webb 06/15/2015 1:06 PM

## 2015-05-29 NOTE — Progress Notes (Signed)
Withdrawal of support- pt extubated.  RN at bedside.

## 2015-05-29 NOTE — Progress Notes (Signed)
Orders discontinued to check sodium as directed by Dr. Nicole Kindred. Per Dr. Nicole Kindred we will not start 3% saline at this time.

## 2015-05-29 NOTE — Progress Notes (Signed)
Warming blanket applied due to axillary temperature being 95.3

## 2015-05-29 NOTE — Telephone Encounter (Signed)
thanks

## 2015-05-29 NOTE — Care Management Note (Signed)
Case Management Note  Patient Details  Name: Brent Webb MRN: [id number] Date of Birth: 05-Feb-1935  Subjective/Objective:     Pt admitted on 05/26/2015 with hemorrhagic stroke.  PTA, pt independent; has supportive family.                 Action/Plan: Pt with grave prognosis, no chance for meaningful recovery.  Family offered comfort measures and pt was terminally extubated this afternoon.    Expected Discharge Date:                  Expected Discharge Plan:  Expired  In-House Referral:     Discharge planning Services  CM Consult  Post Acute Care Choice:    Choice offered to:     DME Arranged:    DME Agency:     HH Arranged:    [place] Agency:     Status of Service:  Completed, signed off  Medicare Important Message Given:    Date Medicare IM Given:    Medicare IM give by:    Date Additional Medicare IM Given:    Additional Medicare Important Message give by:     If discussed at [place] of Stay Meetings, dates discussed:    Additional Comments:  Reinaldo Raddle, RN, BSN  Trauma/Neuro ICU Case Manager [phone]

## 2015-05-29 NOTE — Progress Notes (Signed)
SLP Cancellation Note  Patient Details Name: Brent Webb MRN: [id number] DOB: 1935-04-26   Cancelled treatment:       Reason Eval/Treat Not Completed: Other (comment). Per chart review and discussion with RN, note that pt remains intubated and has had significant enlargement of bleed. RN shares that pt is likely transitioning to comfort care. SLP to sign off at this time. Please re-order SLP if needed.   Germain Osgood, M.A. CCC-SLP [phone]  Germain Osgood 05/30/2015, 10:31 AM

## 2015-05-29 NOTE — Progress Notes (Signed)
PT Cancellation and Discharge Note  Patient Details Name: Brent Webb MRN: [id number] DOB: Apr 22, 1935   Cancelled Treatment:    Reason Eval/Treat Not Completed: PT screened, no needs identified, will sign off.  Noted pt likely transitioning to [place].  Will sign off PT and need new order if wishes change and pt becomes medically appropriate for PT and mobility.     Catarina Hartshorn, [place] 06/04/2015, 11:58 AM

## 2015-05-29 NOTE — Progress Notes (Signed)
Nutrition Brief Note  RD drawn to patient chart due to vent status. Family has decided on withdrawal of care and pt has been extubated.  No nutrition interventions warranted at this time. If nutrition issues arise, please consult RD.   Scarlette Ar RD, LDN Inpatient Clinical Dietitian Pager: [phone] After Hours Pager: [phone]

## 2015-05-29 NOTE — Progress Notes (Signed)
Dr. Nicole Kindred notified that patient is in A-fib at a rate of 100-130s. No further orders at this time. Will continue to monitor. Family also asking for prn pain medication. Propofol increased as directed by Dr. Nicole Kindred. No orders for new medications at this time. Will continue to monitor.

## 2015-05-29 NOTE — Progress Notes (Signed)
   06/03/2015 1125  Clinical Encounter Type  Visited With Patient and family together;Health care provider  Visit Type Initial  Referral From Nurse   Chaplain met briefly with the patient's family. Family stated they had a priest come by yesterday to offer a final blessing. Chaplain support available as needed.   Jeri Lager, Chaplain 06/08/2015 11:26 AM

## 2015-05-29 NOTE — Progress Notes (Signed)
Wasted 223ml morphine in sink.  Shara Blazing, RN witnessed waste. [place], [place] C 06/02/2015 3:19 PM

## 2015-05-29 NOTE — Progress Notes (Signed)
eLink Physician-Brief Progress Note Patient Name: Lockwood Schnur DOB: [birth year] MRN: [id number]   Date of Service  05/26/2015  HPI/Events of Note  Urinary retention - bladder scan >> 1000 mL + residual.   eICU Interventions  Place Foley Catheter.      Intervention Category Intermediate Interventions: Other:  Lysle Dingwall 05/28/2015, 2:33 AM

## 2015-05-29 NOTE — Progress Notes (Signed)
STROKE TEAM PROGRESS NOTE   HISTORY Brent Webb is an 80 y.o. male with a past medical history significant for atrial fibrillation on coumadin, s/p watchman procedure 2 weeks ago, brought in by EMS as a code stroke due to acute onset of headache, left hemiparesis, dysarthria, and left facial weakness. Patient lives at home with his wife, and today around noon time complained of having a HA and subsequently developed slurred speech, left face droopiness, and left hemiparesis. EMS was summoned and upon arrival to ED patient had SBP 162, preserved sensorium, but profuse vomiting with severe HA. CT brain revealed a large intra cerebral hematoma within RIGHT temporal lobe with evidence of rupture into the RIGHT sylvian fissure, hematoma overall measuring approximately 6.0 x 5.0 x 6.0 cm in size. INR 1.90; PTT 37, platelet count 235. SBP>162 while in the ED and received 10 mg IV labetalol.  Patient received zofran for intractable nausea and vomiting and due to extremely severe HA was given a single dose of fentanyl. His neurological status declined and was intubated in the ED for airway protection.  NIHSS 8 ICH VOLUME 90 CC ICH SCORE: 2   SUBJECTIVE (INTERVAL HISTORY) Dr. Leonie Man had a long discussion with the patient's family regarding the grim prognosis. Comfort measures were provided and the patient will be terminally extubated.   OBJECTIVE Temp:  [95.8 F (35.4 C)-98.1 F (36.7 C)] 97.4 F (36.3 C) (12/19 0750) Pulse Rate:  [26-140] 65 (12/19 0759) Cardiac Rhythm:  [-] Atrial fibrillation (12/19 0030) Resp:  [13-26] 14 (12/19 0759) BP: (88-196)/(51-118) 196/74 mmHg (12/19 0759) SpO2:  [84 %-100 %] 100 % (12/19 0759) FiO2 (%):  [30 %-40 %] 30 % (12/19 0759) Weight:  [65.3 kg (143 lb 15.4 oz)-65.7 kg (144 lb 13.5 oz)] 65.3 kg (143 lb 15.4 oz) (12/18 1905)  CBC:  Recent Labs Lab 05/25/2015 1704 05/11/2015 1711 06/10/2015 0548  WBC 6.0  --  12.4*  NEUTROABS 4.1  --   --   HGB 14.7 15.3  14.9  HCT 42.8 45.0 44.0  MCV 88.8  --  87.6  PLT 235  --  0000000    Basic Metabolic Panel:  Recent Labs Lab 05/15/2015 1704 05/17/2015 1711 05/24/2015 1946 06/07/2015 0548  NA 139 140 139 143  K 4.5 4.2  --  4.0  CL 106 103  --  104  CO2 26  --   --  27  GLUCOSE 99 96  --  131*  BUN 17 22*  --  15  CREATININE 1.30* 1.20  --  1.26*  CALCIUM 9.5  --   --  9.3  MG  --   --   --  2.1  PHOS  --   --   --  4.0    Lipid Panel: No results found for: CHOL, TRIG, HDL, CHOLHDL, VLDL, LDLCALC HgbA1c: No results found for: HGBA1C Urine Drug Screen: No results found for: LABOPIA, COCAINSCRNUR, LABBENZ, AMPHETMU, THCU, LABBARB    IMAGING   Ct Head Wo Contrast 06/04/2015   Massive increase in size of the right hemispheric intraparenchymal hematoma, now 7 x 10 cm. Subarachnoid penetration. Uncal herniation. Likely hemorrhagic infarction of the brainstem. Trapping of the left lateral ventricle.    Ct Head Wo Contrast 06/04/2015   Large intra cerebral hematoma within RIGHT temporal lobe with evidence of rupture into the RIGHT sylvian fissure, hematoma overall measuring approximately 6.0 x 5.0 x 6.0 cm in size. Edema with thin RIGHT hemisphere with mass effect upon RIGHT lateral  ventricle and 4 mm of RIGHT to LEFT midline shift.     Dg Chest Port 1 View 05/15/2015   Support apparatus projects in good position.  No acute abnormality.    Dg Chest Portable 1 View 05/31/2015   The endotracheal and nasogastric tubes appears satisfactorily positioned. No acute chest findings.      PHYSICAL EXAM Elderly Caucasian male who is intubated not on sedation. . Afebrile. Head is nontraumatic. Neck is supple without bruit.    Cardiac exam no murmur or gallop. Lungs are clear to auscultation. Distal pulses are well felt. Neurological Exam :  Comatose and unresponsive. Eyes are closed. Pupils 5 mm and not reactive. Doll's eye movements are absent. Fundi were not visualized. Corneal reflexes absent. No  cough or gag. Slight respiratory effort above the ventilator noted. No response to sternal rub, nailbed pressure. Minimum lower extremity surgeon response to deep pain. Tone is diminished bilaterally. Plantars are both not elicitable.   ASSESSMENT/PLAN [name] is a 80 y.o. male with history of atrial fibrillation on coumadin, s/p watchman procedure 2 weeks ago,  presenting with headache, left hemiparesis, dysarthria, and left facial weakness.  He did not receive IV t-PA due to hemorrhage.   Stroke: Nondominant - Large intra cerebral hematoma within RIGHT temporal lobe  Resultant comatose state with near brain death exam. no chance for a meaningful recovery  MRI  not performed  MRA  not performed  Carotid Doppler not ordered  2D Echo not ordered  LDL - not ordered  HgbA1c not ordered  VTE prophylaxis - SCDs  Diet NPO time specified  aspirin 81 mg daily prior to admission, now on No antithrombotic secondary to hemorrhage  Terminally extubated and deceased  Hospital day # [place] PA-C [facility] Pager [phone] 06/02/2015, 5:20 PM I have personally examined this patient, reviewed notes, independently viewed imaging studies, participated in medical decision making and plan of care. I have made any additions or clarifications directly to the above note . He is unfortunately had a massive intracerebral hemorrhage with significant cytotoxic edema, brain herniation and brain stem damage which isn't compatible with life. I had an extensive discussion with patient's wife, 2 sons about his near brain death exam and answered questions. Family is leaning towards withdrawal of care but await arrival of daughter later this morning. This patient is critically ill and at significant risk of neurological worsening, death and care requires constant monitoring of vital signs, hemodynamics,respiratory and cardiac monitoring, extensive review of multiple databases,  frequent neurological assessment, discussion with family, other specialists and medical decision making of high complexity.I have made any additions or clarifications directly to the above note.This critical care time does not reflect procedure time, or teaching time or supervisory time of PA/NP/Med Resident etc but could involve care discussion time.  I spent 70 minutes of neurocritical care time  in the care of  this patient.     Antony Contras, MD Medical Director [facility] Pager: [phone] 06/05/2015 5:35 PM     To contact Stroke Continuity provider, please refer to [email or web address] After hours, contact General Neurology

## 2015-05-30 NOTE — Progress Notes (Signed)
Carelink Summary Report / Loop Recorder 

## 2015-05-31 NOTE — Discharge Summary (Signed)
Patient ID: Stevyn Madden MRN: [id number] DOB/AGE: 1934-07-05 80 y.o.  Admit date: 06-15-2015 Death date: Jun 16, 2015 at 64  Admission Diagnoses: ICH  Cause of Death:  Cardiac arrest secondary to brain herniation due to increased intracranial pressure from large right brain parenchymal hemorrhage related to warfarin coagulopathy with underlying atrial fibrillation. Prior remote history is of intracerebral hemorrhage x 2 of undetermined etiology possibly amyloid angiopathy.  Pertinent Medical Diagnosis: Active Problems:   Cerebral hemorrhage (HCC)   Respiratory failure (HCC)   Atrial fibrillation [facility])   Hospital Course:  Nyzir Oki is an 80 y.o. male with a past medical history significant for atrial fibrillation on coumadin, s/p watchman procedure 2 weeks ago, brought in by EMS as a code stroke due to acute onset of headache, left hemiparesis, dysarthria, and left facial weakness. Patient lives at home with his wife, and on 06/15/15 around noon time complained of having a HA and subsequently developed slurred speech, left face droopiness, and left hemiparesis. EMS was summoned and upon arrival to ED patient had SBP 162, preserved sensorium, but profuse vomiting with severe HA.Marland Kitchen Patient had prior history of embolic stroke from atrial fibrillation and had recently undergone watchman device placement for treatment for atrial fibrillation at [facility] by Dr. Clyda Hurdle and was on anticoagulation with warfarin. He had a past history of intracerebral hemorrhage 2 of undetermined etiology possibly amyloid angiopathy and hence was not considered a good candidate for long-term anticoagulation and chose to have the watchman device done CT brain revealed a large intra cerebral hematoma within RIGHT temporal lobe with evidence of rupture into the RIGHT sylvian fissure, hematoma overall measuring approximately 6.0 x 5.0 x 6.0 cm in size. INR 1.90; PTT 37, platelet count 235. SBP>162 while in the ED and  received 10 mg IV labetalol.  Patient received zofran for intractable nausea and vomiting and due to extremely severe HA was given a single dose of fentanyl. His neurological status declined and was intubated in the ED for airway protection. NIHSS 8 ICH VOLUME 90 CC ICH SCORE: 2 Patient was admitted to the intensive care unit where he was intubated for airway protection. Patient's warfarin cognitively was reversed with K centra and vitamin K. An INR of on admission 1.92 him down to 1.16 however patient's neurological condition continued to decline and repeat CT scan of the head showed enlargement of the parenchymal hematoma in the right hemisphere which increase in size from 5 x 6 cm 7 x 10 cm with subarachnoid extension and 1.5 cm right to left midline shift with uncal herniation and low density involving the brainstem with trapping of the left lateral ventricle which became dilated. Patient had a neurological exam consistent with near brain death except for slight respiratory drive it is preserved. I had multiple discussions with the patient and his wife has and 3 of her sons. I had been caring for this patient for several years for his strokes. The patient's family understood his poor prognosis and chances of survival with meaningful recovery being nonexistent. Patient clearly would not have wanted prolonged life support and hence family elected to make him DO NOT RESUSCITATE and withdraw ventilatory support which was done. Patient was found to be pulseless and not breathing shortly thereafter and pronounced dead by the RN. The patient had requested that his body be donated to [employer] and his wishes were honored. Signed: Crissy Mccreadie 05/31/2015, 8:29 PM

## 2015-06-11 DEATH — deceased

## 2015-06-15 ENCOUNTER — Encounter: Payer: Self-pay | Admitting: Cardiology

## 2015-06-21 ENCOUNTER — Encounter: Payer: Self-pay | Admitting: Cardiology

## 2015-06-26 IMAGING — CT CT HEAD W/O CM
1 series · 16 of 30 positions shown, 20 images · non-contrast
Comparison: Prior CT head 09/21/2012 its

CLINICAL DATA: Right lip droop, headache. Past personal history of
stroke.

EXAM:
CT HEAD WITHOUT CONTRAST
TECHNIQUE: Contiguous axial images were obtained from the base of the skull
through the vertex without intravenous contrast.

[Series 2: head 5.0 h30s · axial · 0.44mm/px · z∈[-154,-4]mm · 16 of 34 slices shown, 20 images]
[im 2/34  brain]
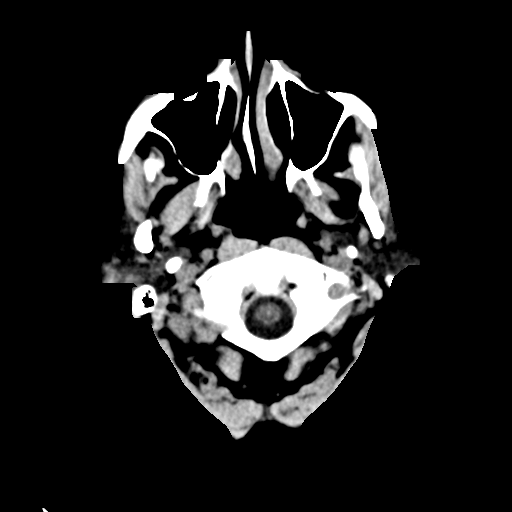
[im 2/34  bone]
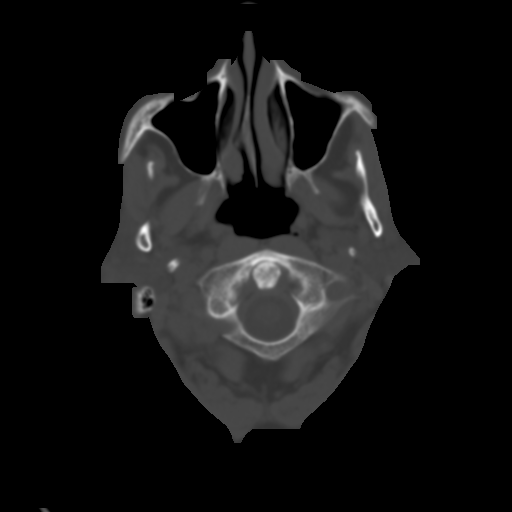
[im 4/34  brain]
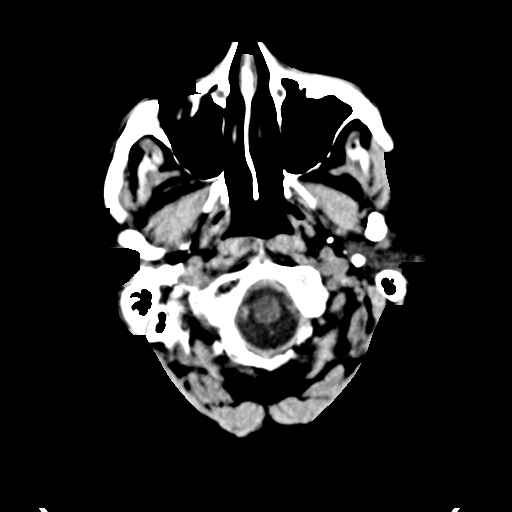
[im 6/34  brain]
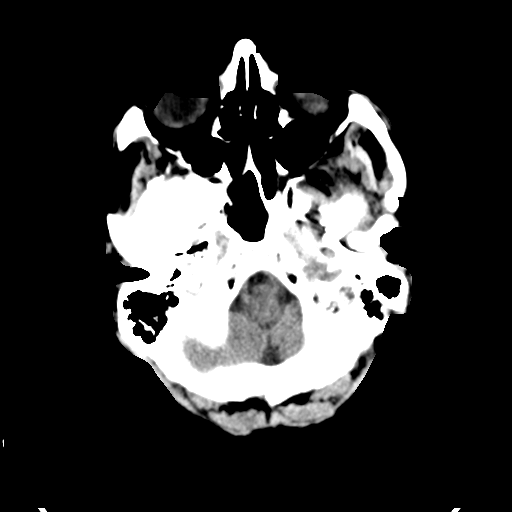
[im 8/34  brain]
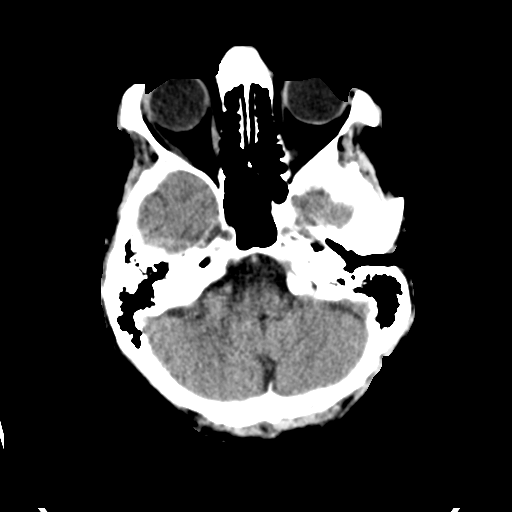
[im 10/34  brain]
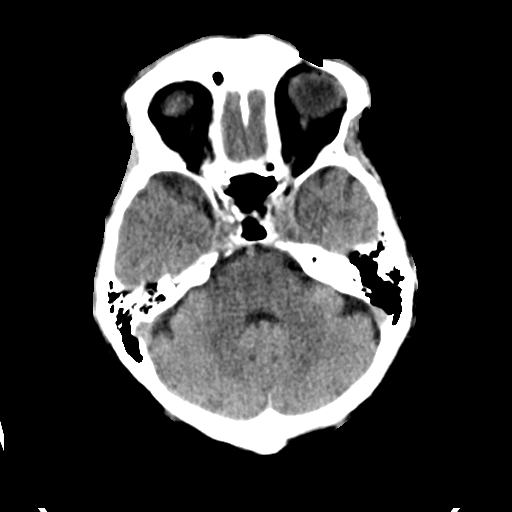
[im 10/34  bone]
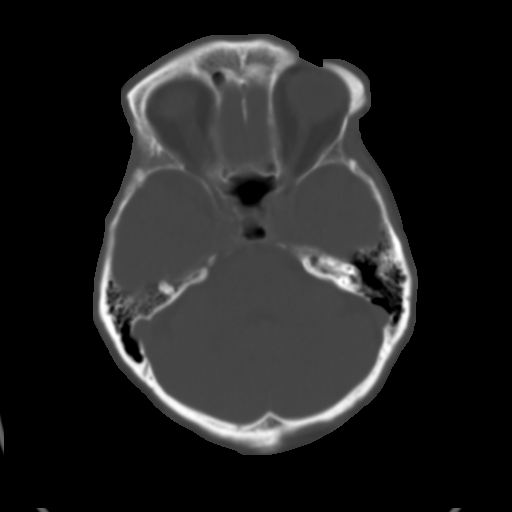
[im 12/34  brain]
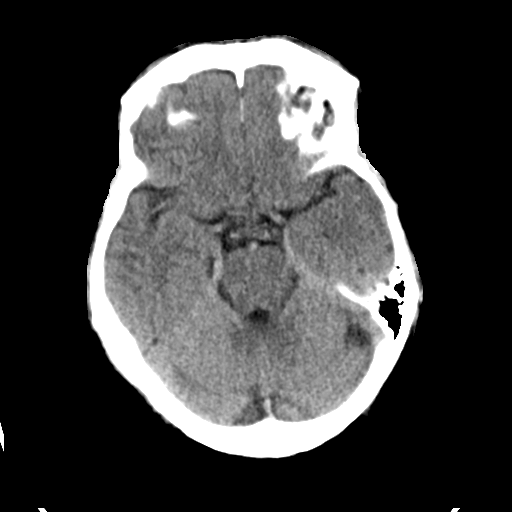
[im 14/34  brain]
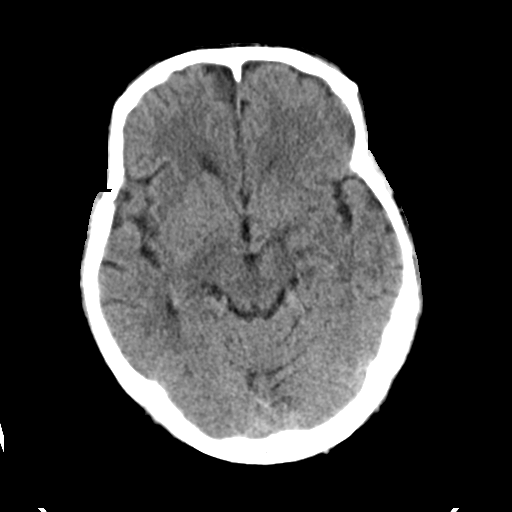
[im 16/34  brain]
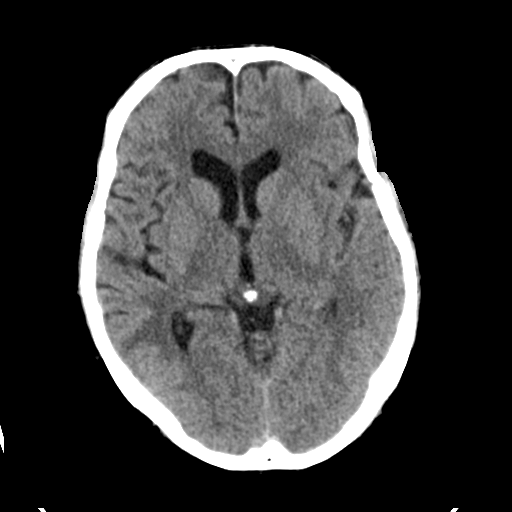
[im 18/34  brain]
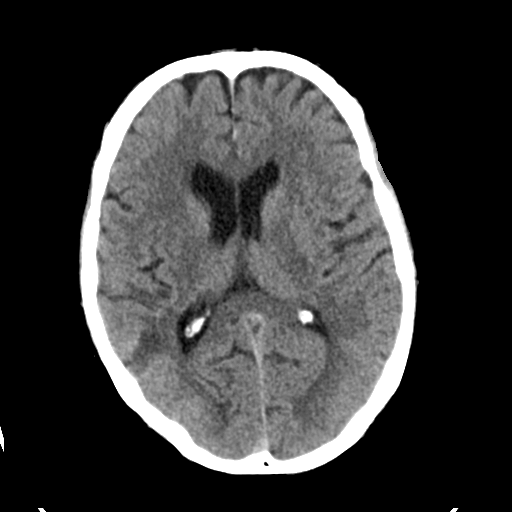
[im 18/34  bone]
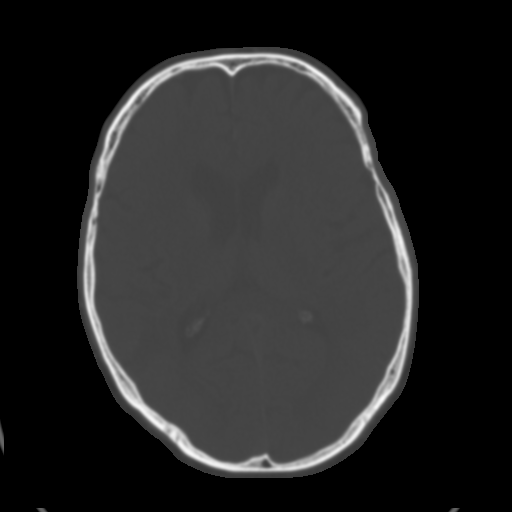
[im 20/34  brain]
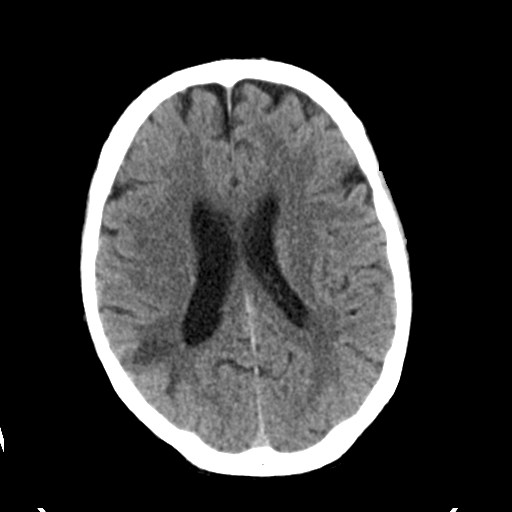
[im 22/34  brain]
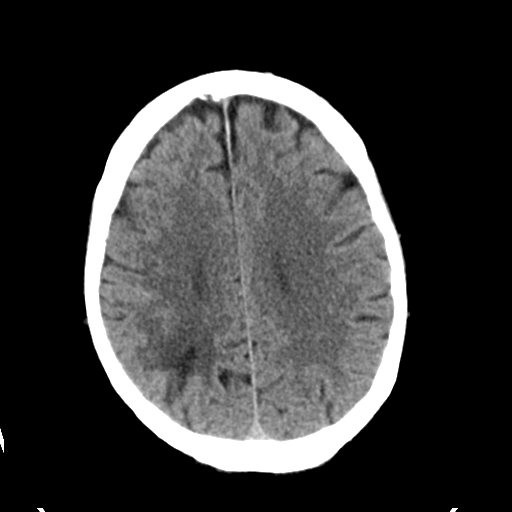
[im 24/34  brain]
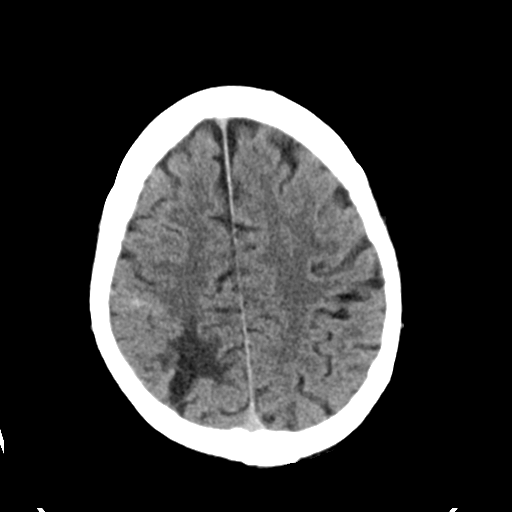
[im 26/34  brain]
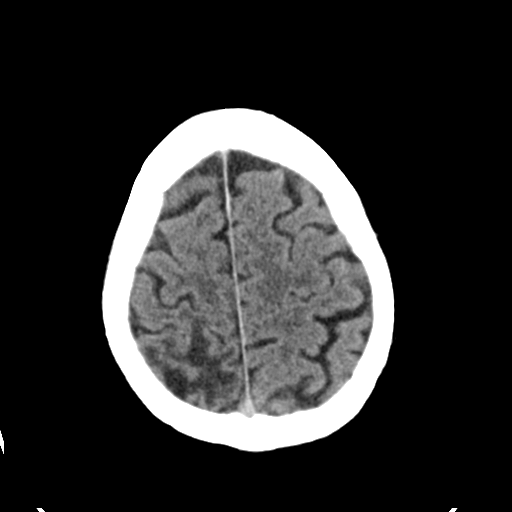
[im 26/34  bone]
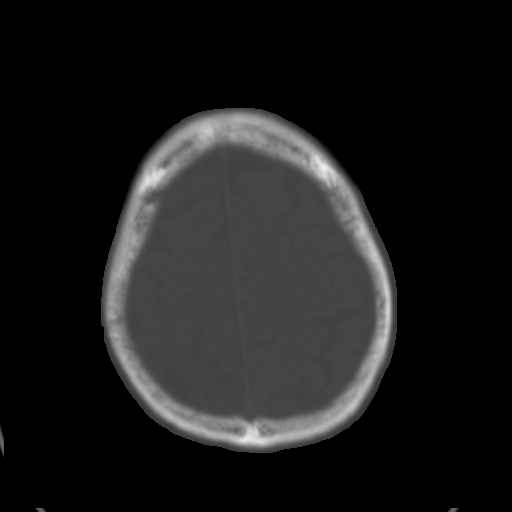
[im 28/34  brain]
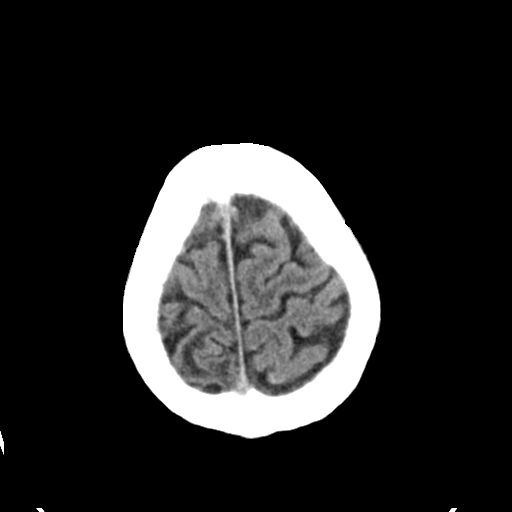
[im 30/34  brain]
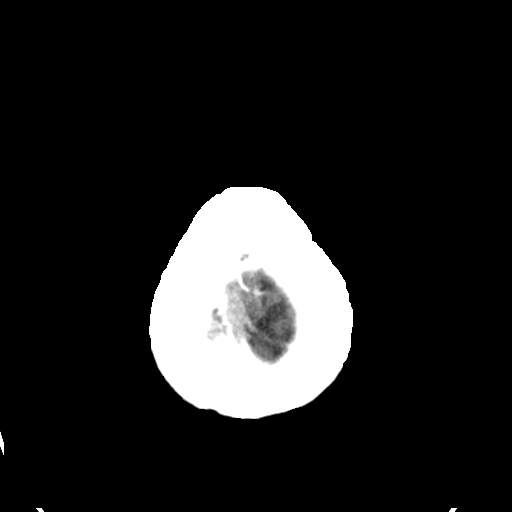
[im 32/34  brain]
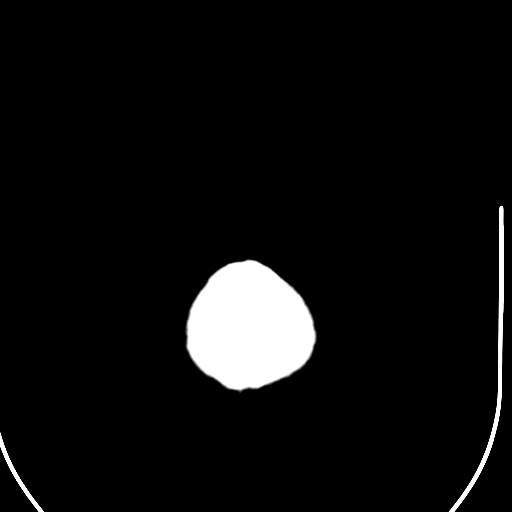

[16 of 30 positions shown; findings below may reference images not displayed]

FINDINGS: Small volume of high attenuation material layering within sulci of
the right high parietal lobe just anterior to the region of
encephalomalacia from the remote prior intraparenchymal hemorrhage.
No definite acute infarct, Um mass, mass effect, hydrocephalus or
midline shift.

Normal soft tissues and calvarium. Normal aeration of the mastoid
air cells and visualized paranasal sinuses.
IMPRESSION: 1. Positive for small volume acute subarachnoid hemorrhage in the
high right parietal lobe.
2. Expected evolution of remote right parietal intraparenchymal
hemorrhage.

Critical Value/emergent results were called by telephone at the time
of interpretation on 11/30/2013 at [DATE] to Dr. AVELINO MAJDA BRANDEBOURG , who
verbally acknowledged these results.

## 2015-06-28 IMAGING — CT CT HEAD W/O CM
1 series · 15 of 30 positions shown, 19 images · non-contrast
Comparison: Prior CT and MRI from 11/30/2013

CLINICAL DATA: Followup intracranial hemorrhage

EXAM:
CT HEAD WITHOUT CONTRAST
TECHNIQUE: Contiguous axial images were obtained from the base of the skull
through the vertex without intravenous contrast.

[Series 2: head 5.0 h30s · axial · 0.48mm/px · z∈[+1262,+1412]mm · 15 of 34 slices shown, 19 images]
[im 2/34  brain]
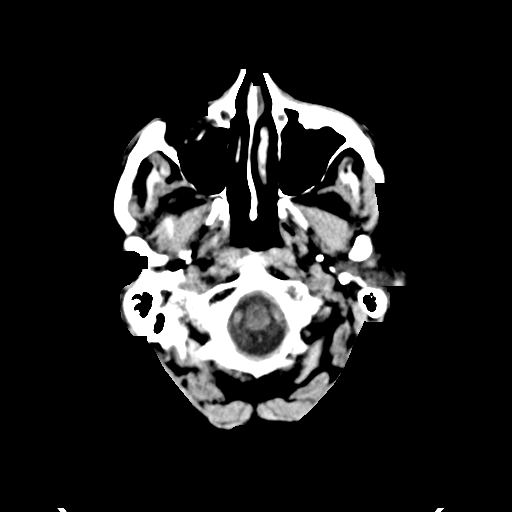
[im 2/34  bone]
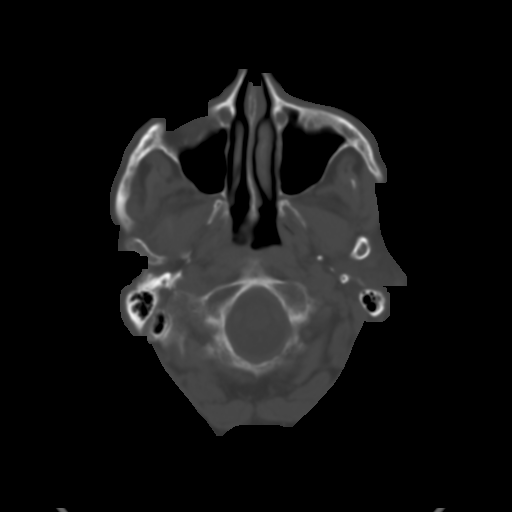
[im 4/34  brain]
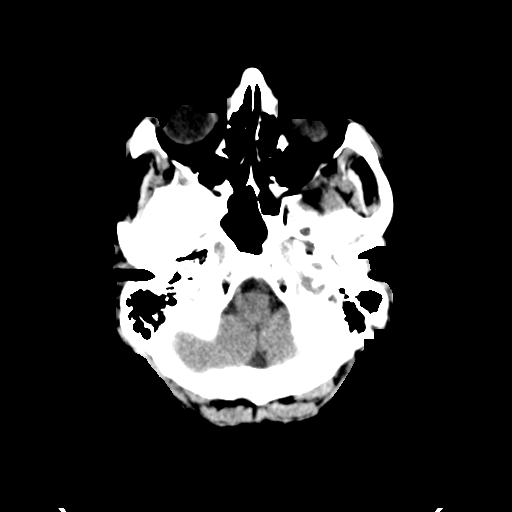
[im 6/34  brain]
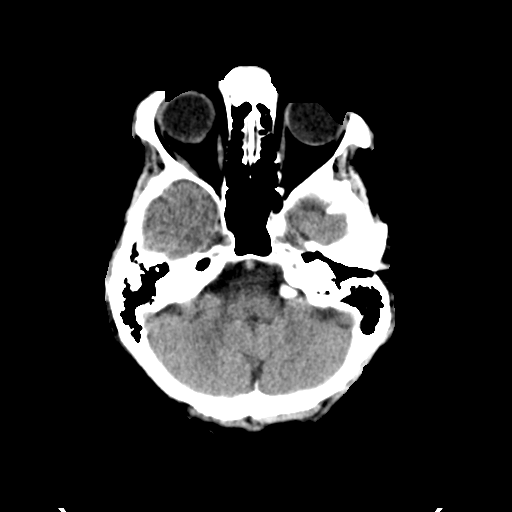
[im 8/34  brain]
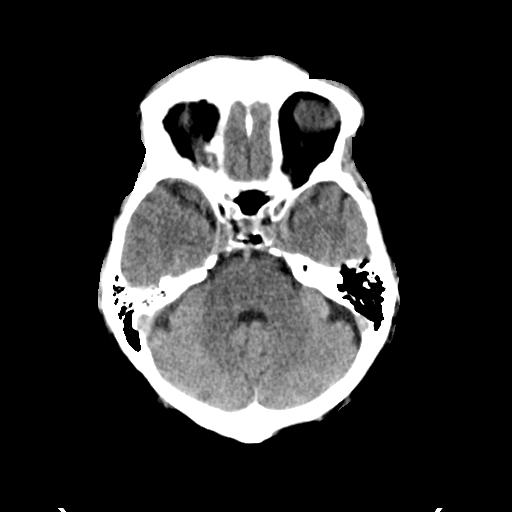
[im 11/34  brain]
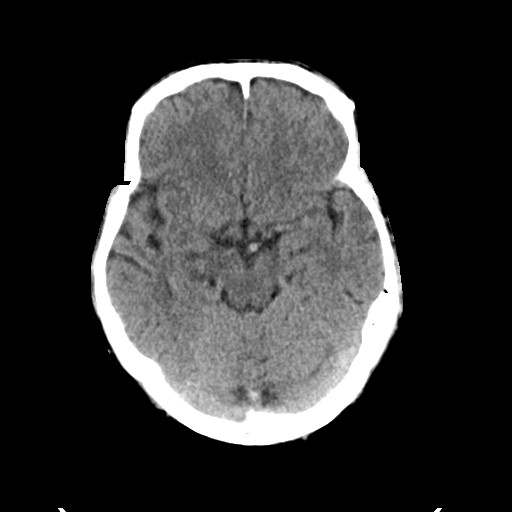
[im 11/34  bone]
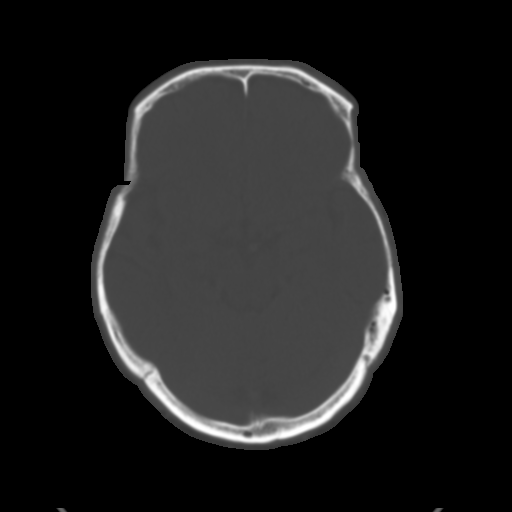
[im 13/34  brain]
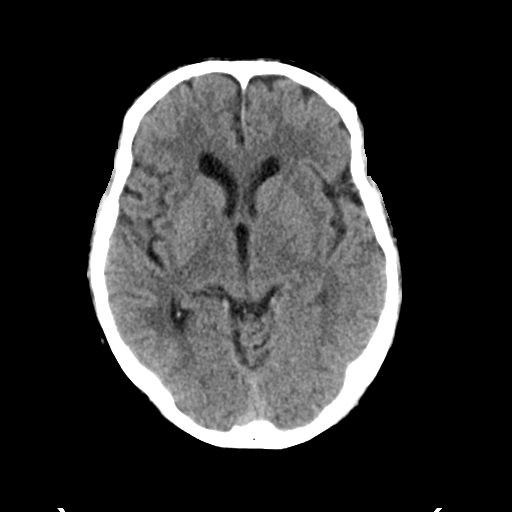
[im 15/34  brain]
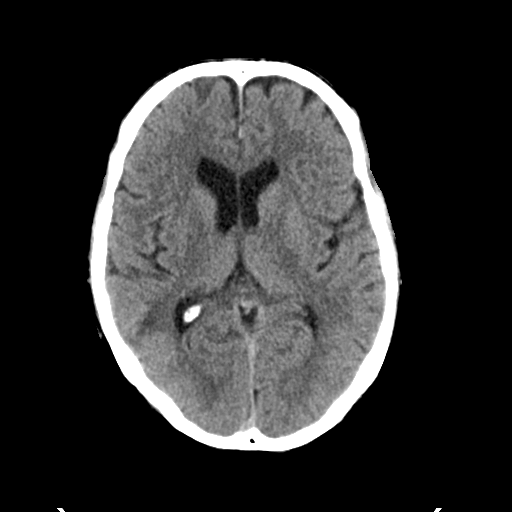
[im 18/34  brain]
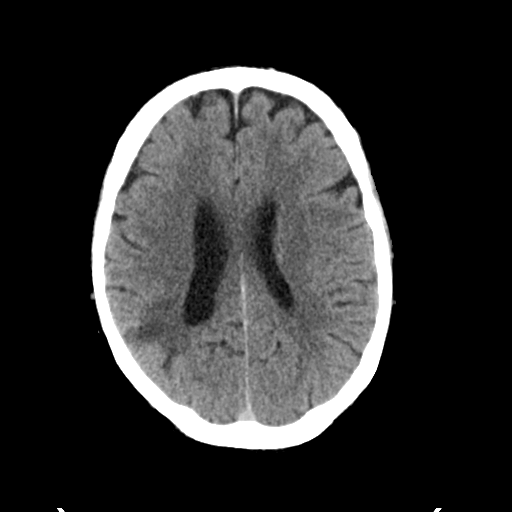
[im 19/34  brain]
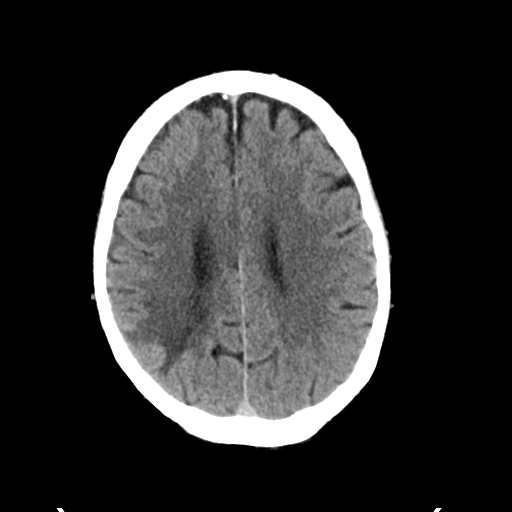
[im 19/34  bone]
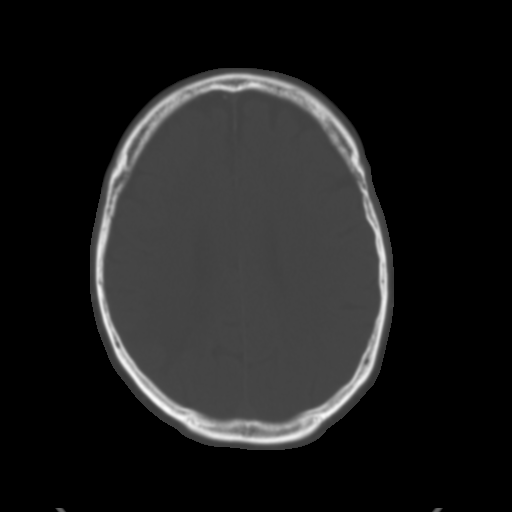
[im 21/34  brain]
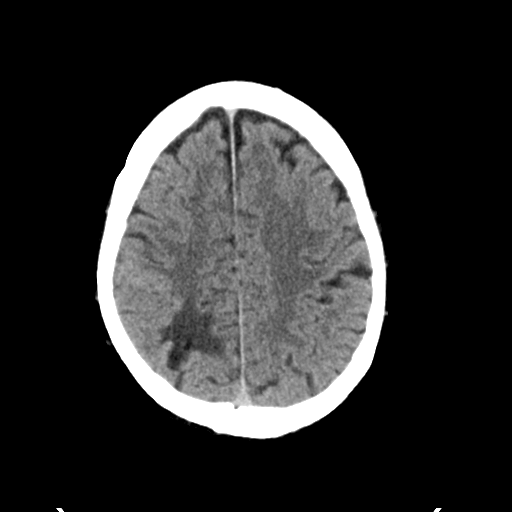
[im 23/34  brain]
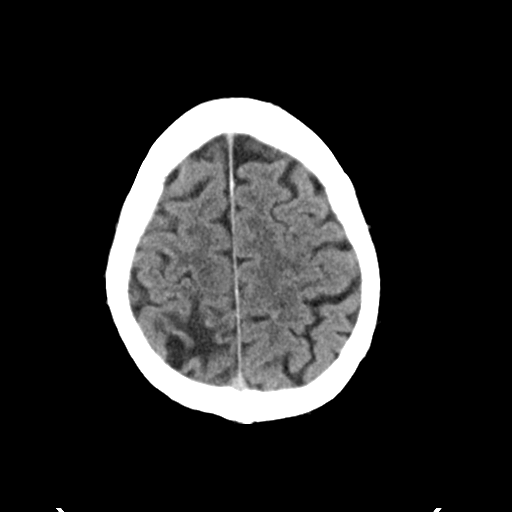
[im 26/34  brain]
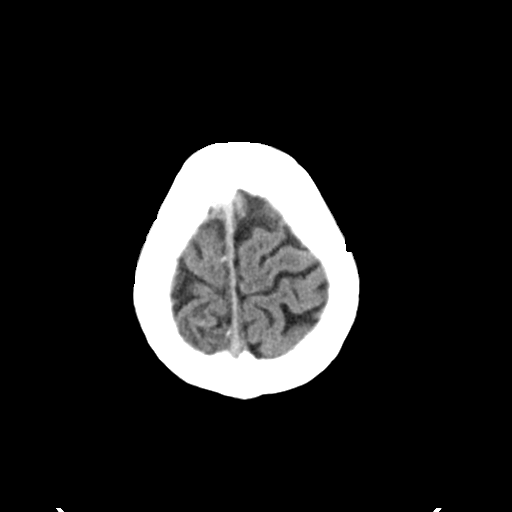
[im 28/34  brain]
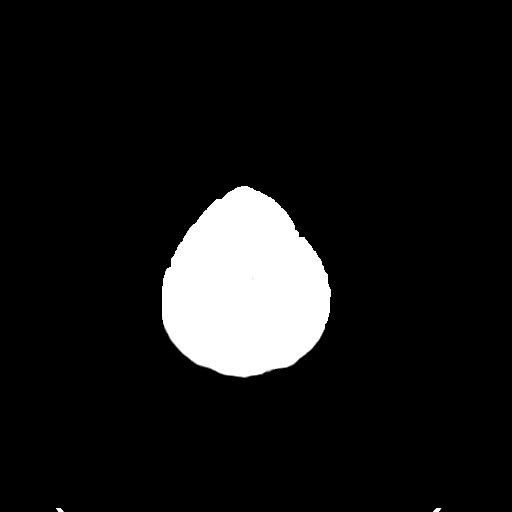
[im 28/34  bone]
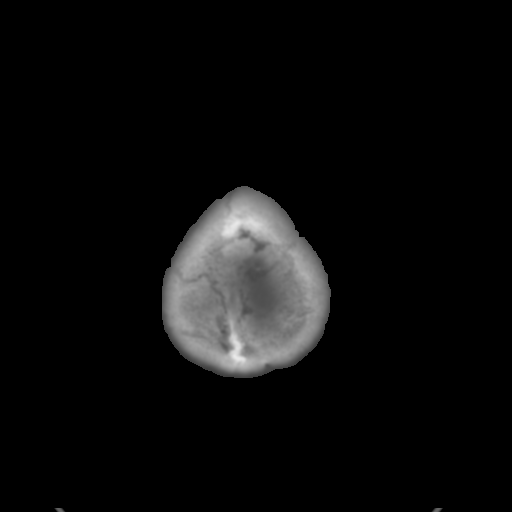
[im 30/34  brain]
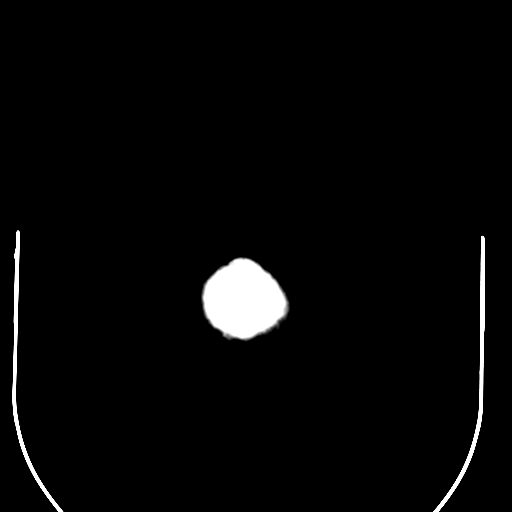
[im 32/34  brain]
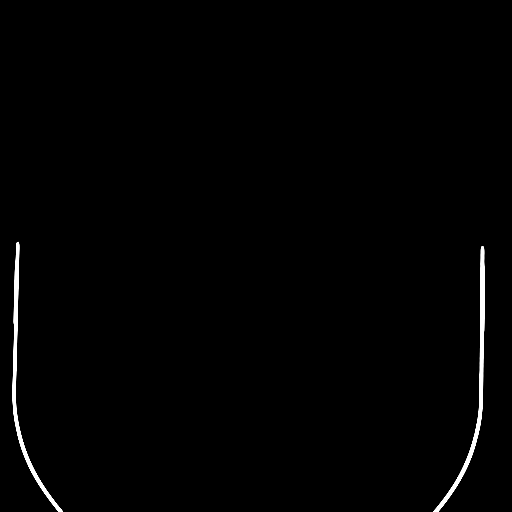

[15 of 30 positions shown; findings below may reference images not displayed]

FINDINGS: Previously seen acute subarachnoid hemorrhage within the high right
parietal lobe is less conspicuous as compared to prior exam,
compatible with redistribution. No new intracranial hemorrhage.
Hypodensity within the posterior right temporal lobe is compatible
with previously identified subacute parenchymal hematoma. Again,
these may be related to amyloid angiopathy.

Previously seen small acute cortical infarct in the right parietal
operculum not definitely seen. No new large vessel territory
infarct. No mass lesion or midline shift. No hydrocephalus. No
extra-axial fluid collection.

Calvarium is intact.  No acute abnormality seen about the orbits.

Paranasal sinuses and mastoid air cells remain clear.
IMPRESSION: 1. Slight interval decrease in conspicuity of acute right parietal
subarachnoid hemorrhage, compatible with redistribution. No new
intracranial hemorrhage or infarct identified.
2. Stable subacute hematoma within the posterior right temporal
lobe, better evaluated on recent brain MRI.

## 2015-06-30 ENCOUNTER — Encounter: Payer: Self-pay | Admitting: Cardiology

## 2015-07-12 ENCOUNTER — Encounter: Payer: Self-pay | Admitting: Cardiology

## 2015-07-27 ENCOUNTER — Ambulatory Visit: Payer: Medicare Other | Admitting: Neurology

## 2015-08-08 ENCOUNTER — Encounter: Payer: Self-pay | Admitting: Internal Medicine
